# Patient Record
Sex: Female | Born: 1935 | Race: Black or African American | Hispanic: No | Marital: Married | Smoking: Never smoker
Health system: Southern US, Community
[De-identification: ages and names within clinical notes are randomized; demographics above are authoritative.]

## PROBLEM LIST (undated history)

## (undated) DIAGNOSIS — E7800 Pure hypercholesterolemia, unspecified: Secondary | ICD-10-CM

## (undated) DIAGNOSIS — J189 Pneumonia, unspecified organism: Secondary | ICD-10-CM

## (undated) DIAGNOSIS — N189 Chronic kidney disease, unspecified: Secondary | ICD-10-CM

## (undated) DIAGNOSIS — K769 Liver disease, unspecified: Secondary | ICD-10-CM

## (undated) DIAGNOSIS — F0390 Unspecified dementia without behavioral disturbance: Secondary | ICD-10-CM

## (undated) DIAGNOSIS — I10 Essential (primary) hypertension: Secondary | ICD-10-CM

## (undated) DIAGNOSIS — K5790 Diverticulosis of intestine, part unspecified, without perforation or abscess without bleeding: Secondary | ICD-10-CM

## (undated) DIAGNOSIS — E46 Unspecified protein-calorie malnutrition: Secondary | ICD-10-CM

## (undated) DIAGNOSIS — K649 Unspecified hemorrhoids: Secondary | ICD-10-CM

## (undated) HISTORY — PX: CATARACT EXTRACTION: SUR2

---

## 1997-09-23 ENCOUNTER — Ambulatory Visit (HOSPITAL_COMMUNITY): Admission: RE | Admit: 1997-09-23 | Discharge: 1997-09-23 | Payer: Self-pay | Admitting: *Deleted

## 1997-10-12 ENCOUNTER — Ambulatory Visit (HOSPITAL_COMMUNITY): Admission: RE | Admit: 1997-10-12 | Discharge: 1997-10-12 | Payer: Self-pay | Admitting: Gastroenterology

## 1998-10-26 ENCOUNTER — Encounter: Payer: Self-pay | Admitting: Family Medicine

## 1998-10-26 ENCOUNTER — Ambulatory Visit (HOSPITAL_COMMUNITY): Admission: RE | Admit: 1998-10-26 | Discharge: 1998-10-26 | Payer: Self-pay | Admitting: Family Medicine

## 1999-06-13 ENCOUNTER — Other Ambulatory Visit: Admission: RE | Admit: 1999-06-13 | Discharge: 1999-06-13 | Payer: Self-pay | Admitting: Specialist

## 1999-12-18 ENCOUNTER — Encounter: Payer: Self-pay | Admitting: Family Medicine

## 1999-12-18 ENCOUNTER — Ambulatory Visit (HOSPITAL_COMMUNITY): Admission: RE | Admit: 1999-12-18 | Discharge: 1999-12-18 | Payer: Self-pay | Admitting: Family Medicine

## 2002-11-10 ENCOUNTER — Other Ambulatory Visit: Admission: RE | Admit: 2002-11-10 | Discharge: 2002-11-10 | Payer: Self-pay | Admitting: Family Medicine

## 2003-03-17 ENCOUNTER — Ambulatory Visit (HOSPITAL_COMMUNITY): Admission: RE | Admit: 2003-03-17 | Discharge: 2003-03-17 | Payer: Self-pay | Admitting: Gastroenterology

## 2003-03-17 ENCOUNTER — Encounter (INDEPENDENT_AMBULATORY_CARE_PROVIDER_SITE_OTHER): Payer: Self-pay | Admitting: *Deleted

## 2003-03-30 ENCOUNTER — Ambulatory Visit (HOSPITAL_COMMUNITY): Admission: RE | Admit: 2003-03-30 | Discharge: 2003-03-30 | Payer: Self-pay | Admitting: Family Medicine

## 2004-12-20 ENCOUNTER — Other Ambulatory Visit: Admission: RE | Admit: 2004-12-20 | Discharge: 2004-12-20 | Payer: Self-pay | Admitting: Family Medicine

## 2005-09-11 ENCOUNTER — Encounter: Admission: RE | Admit: 2005-09-11 | Discharge: 2005-09-11 | Payer: Self-pay | Admitting: Family Medicine

## 2007-04-14 ENCOUNTER — Other Ambulatory Visit: Admission: RE | Admit: 2007-04-14 | Discharge: 2007-04-14 | Payer: Self-pay | Admitting: Family Medicine

## 2010-08-10 NOTE — Op Note (Signed)
NAME:  Jamie Lester, Jamie Lester                      ACCOUNT NO.:  [id number]   MEDICAL RECORD NO.:  [id number]                   PATIENT TYPE:  AMB   LOCATION:  ENDO                                 FACILITY:  [facility]   PHYSICIAN:  Petra Kuba, M.D.                 DATE OF BIRTH:  09/19/1930   DATE OF PROCEDURE:  03/17/2003  DATE OF DISCHARGE:                                 OPERATIVE REPORT   PROCEDURE:  Colonoscopy with polypectomy.   INDICATION:  Family history of colon cancer due for colonic screening.  Consent was signed after risks, benefits, methods, options thoroughly  discussed multiple times in the past.   MEDICINES USED:  1. Demerol 70.  2. Versed 7.   DESCRIPTION OF PROCEDURE:  Rectal inspection was pertinent for external  hemorrhoids, small.  Digital exam was negative.  Video pediatric adjustable  colonoscope was inserted and despite a tortuous sigmoid colon with  diverticula, able to be advanced to the cecum.  This did require rolling her  on her back and some abdominal pressure.  No other obvious abnormality was  seen but an occasional right-sided diverticula.  The cecum was identified by  the appendiceal orifice and the ileocecal valve.  Prep was fairly adequate.  With lots of washing and suctioning, adequate visus was obtained.  On slow  withdrawal through the colon, there was left greater than right diverticula,  as mentioned above.  There was a descending tiny to small polyp which was  hot biopsied x 1.  The scope was further withdrawn.  Two other tiny polyps  were seen, one tiny, one in the transverse which was cold biopsied and one  other tiny one in the descending which was hot biopsied.  They were all put  in the same container.  Other than the diverticula, no other abnormalities  were seen as we slowly withdrew back to the rectum.  Anorectal pull-through  and retroflexion confirmed some small hemorrhoids.  The scope was  straightened, readvanced a short ways up  the left side of the colon; air was  suctioned, the scope removed.  The patient tolerated the procedure well.  There was no obvious immediate complication.   ENDOSCOPIC DIAGNOSES:  1. Internal-external hemorrhoids.  2. Left greater than right diverticula.  3. Three tiny ascending, transverse, and descending polyps, all hot or cold     biopsied, put in the same container.  4. Otherwise, within normal limits to the cecum.   PLAN:  1. We will a wait pathology.  2. Gastrointestinal follow-up p.r.n.  3. Yearly rectals and guaiacs per Dr. Nicholos Johns.  4. Based on pathology, will determine future colonic screening.  Petra Kuba, M.D.    MEM/MEDQ  D:  03/17/2003  T:  03/17/2003  Job:  [id number]   cc:   Molly Maduro A. Nicholos Johns, M.D.  [address]  [place]  [place] [address]  Fax: [phone]

## 2011-09-04 DIAGNOSIS — Z961 Presence of intraocular lens: Secondary | ICD-10-CM | POA: Diagnosis not present

## 2011-09-04 DIAGNOSIS — H04129 Dry eye syndrome of unspecified lacrimal gland: Secondary | ICD-10-CM | POA: Diagnosis not present

## 2011-09-04 DIAGNOSIS — H52209 Unspecified astigmatism, unspecified eye: Secondary | ICD-10-CM | POA: Diagnosis not present

## 2011-09-04 DIAGNOSIS — H3530 Unspecified macular degeneration: Secondary | ICD-10-CM | POA: Diagnosis not present

## 2011-10-28 DIAGNOSIS — R5383 Other fatigue: Secondary | ICD-10-CM | POA: Diagnosis not present

## 2011-10-28 DIAGNOSIS — K625 Hemorrhage of anus and rectum: Secondary | ICD-10-CM | POA: Diagnosis not present

## 2011-10-28 DIAGNOSIS — K649 Unspecified hemorrhoids: Secondary | ICD-10-CM | POA: Diagnosis not present

## 2011-10-28 DIAGNOSIS — R5381 Other malaise: Secondary | ICD-10-CM | POA: Diagnosis not present

## 2011-10-28 DIAGNOSIS — L989 Disorder of the skin and subcutaneous tissue, unspecified: Secondary | ICD-10-CM | POA: Diagnosis not present

## 2011-11-14 DIAGNOSIS — L821 Other seborrheic keratosis: Secondary | ICD-10-CM | POA: Diagnosis not present

## 2011-11-27 DIAGNOSIS — Z86010 Personal history of colonic polyps: Secondary | ICD-10-CM | POA: Diagnosis not present

## 2011-11-27 DIAGNOSIS — K625 Hemorrhage of anus and rectum: Secondary | ICD-10-CM | POA: Diagnosis not present

## 2011-11-27 DIAGNOSIS — R195 Other fecal abnormalities: Secondary | ICD-10-CM | POA: Diagnosis not present

## 2011-11-27 DIAGNOSIS — Z800 Family history of malignant neoplasm of digestive organs: Secondary | ICD-10-CM | POA: Diagnosis not present

## 2011-12-25 ENCOUNTER — Other Ambulatory Visit: Payer: Self-pay | Admitting: Gastroenterology

## 2011-12-25 DIAGNOSIS — Z86010 Personal history of colonic polyps: Secondary | ICD-10-CM | POA: Diagnosis not present

## 2011-12-25 DIAGNOSIS — Z800 Family history of malignant neoplasm of digestive organs: Secondary | ICD-10-CM | POA: Diagnosis not present

## 2011-12-25 DIAGNOSIS — Z09 Encounter for follow-up examination after completed treatment for conditions other than malignant neoplasm: Secondary | ICD-10-CM | POA: Diagnosis not present

## 2011-12-25 DIAGNOSIS — D126 Benign neoplasm of colon, unspecified: Secondary | ICD-10-CM | POA: Diagnosis not present

## 2011-12-25 DIAGNOSIS — K5730 Diverticulosis of large intestine without perforation or abscess without bleeding: Secondary | ICD-10-CM | POA: Diagnosis not present

## 2013-03-29 DIAGNOSIS — R059 Cough, unspecified: Secondary | ICD-10-CM | POA: Diagnosis not present

## 2013-03-29 DIAGNOSIS — R05 Cough: Secondary | ICD-10-CM | POA: Diagnosis not present

## 2013-03-29 DIAGNOSIS — J209 Acute bronchitis, unspecified: Secondary | ICD-10-CM | POA: Diagnosis not present

## 2013-09-15 DIAGNOSIS — T7840XA Allergy, unspecified, initial encounter: Secondary | ICD-10-CM | POA: Diagnosis not present

## 2014-12-13 DIAGNOSIS — Z0000 Encounter for general adult medical examination without abnormal findings: Secondary | ICD-10-CM | POA: Diagnosis not present

## 2014-12-13 DIAGNOSIS — R8299 Other abnormal findings in urine: Secondary | ICD-10-CM | POA: Diagnosis not present

## 2014-12-13 DIAGNOSIS — N183 Chronic kidney disease, stage 3 (moderate): Secondary | ICD-10-CM | POA: Diagnosis not present

## 2014-12-13 DIAGNOSIS — I10 Essential (primary) hypertension: Secondary | ICD-10-CM | POA: Diagnosis not present

## 2014-12-20 ENCOUNTER — Other Ambulatory Visit: Payer: Self-pay | Admitting: Family Medicine

## 2014-12-20 DIAGNOSIS — R42 Dizziness and giddiness: Secondary | ICD-10-CM

## 2014-12-23 ENCOUNTER — Ambulatory Visit
Admission: RE | Admit: 2014-12-23 | Discharge: 2014-12-23 | Disposition: A | Discharge status: Home / Self Care | Payer: Medicare Other | Source: Ambulatory Visit | Attending: Family Medicine | Admitting: Family Medicine

## 2014-12-23 DIAGNOSIS — R42 Dizziness and giddiness: Secondary | ICD-10-CM

## 2015-01-10 DIAGNOSIS — Z79899 Other long term (current) drug therapy: Secondary | ICD-10-CM | POA: Diagnosis not present

## 2015-01-10 DIAGNOSIS — R7989 Other specified abnormal findings of blood chemistry: Secondary | ICD-10-CM | POA: Diagnosis not present

## 2015-01-10 DIAGNOSIS — E785 Hyperlipidemia, unspecified: Secondary | ICD-10-CM | POA: Diagnosis not present

## 2015-01-10 DIAGNOSIS — I10 Essential (primary) hypertension: Secondary | ICD-10-CM | POA: Diagnosis not present

## 2015-05-10 DIAGNOSIS — N2581 Secondary hyperparathyroidism of renal origin: Secondary | ICD-10-CM | POA: Diagnosis not present

## 2015-05-10 DIAGNOSIS — R7989 Other specified abnormal findings of blood chemistry: Secondary | ICD-10-CM | POA: Diagnosis not present

## 2015-05-10 DIAGNOSIS — N183 Chronic kidney disease, stage 3 (moderate): Secondary | ICD-10-CM | POA: Diagnosis not present

## 2015-06-14 DIAGNOSIS — N183 Chronic kidney disease, stage 3 (moderate): Secondary | ICD-10-CM | POA: Diagnosis not present

## 2015-06-14 DIAGNOSIS — N390 Urinary tract infection, site not specified: Secondary | ICD-10-CM | POA: Diagnosis not present

## 2015-06-14 DIAGNOSIS — E785 Hyperlipidemia, unspecified: Secondary | ICD-10-CM | POA: Diagnosis not present

## 2015-06-14 DIAGNOSIS — R8299 Other abnormal findings in urine: Secondary | ICD-10-CM | POA: Diagnosis not present

## 2015-06-14 DIAGNOSIS — I10 Essential (primary) hypertension: Secondary | ICD-10-CM | POA: Diagnosis not present

## 2015-06-14 DIAGNOSIS — Z79899 Other long term (current) drug therapy: Secondary | ICD-10-CM | POA: Diagnosis not present

## 2015-06-19 ENCOUNTER — Other Ambulatory Visit: Payer: Self-pay | Admitting: Nephrology

## 2015-06-19 DIAGNOSIS — R7989 Other specified abnormal findings of blood chemistry: Secondary | ICD-10-CM

## 2015-06-19 DIAGNOSIS — N2581 Secondary hyperparathyroidism of renal origin: Secondary | ICD-10-CM

## 2015-06-19 DIAGNOSIS — N183 Chronic kidney disease, stage 3 unspecified: Secondary | ICD-10-CM

## 2015-06-29 ENCOUNTER — Ambulatory Visit
Admission: RE | Admit: 2015-06-29 | Discharge: 2015-06-29 | Disposition: A | Discharge status: Home / Self Care | Payer: Medicare Other | Source: Ambulatory Visit | Attending: Nephrology | Admitting: Nephrology

## 2015-06-29 DIAGNOSIS — N183 Chronic kidney disease, stage 3 unspecified: Secondary | ICD-10-CM

## 2015-06-29 DIAGNOSIS — N2581 Secondary hyperparathyroidism of renal origin: Secondary | ICD-10-CM

## 2015-06-29 DIAGNOSIS — R7989 Other specified abnormal findings of blood chemistry: Secondary | ICD-10-CM

## 2015-07-14 ENCOUNTER — Encounter (HOSPITAL_COMMUNITY): Payer: Self-pay

## 2015-07-14 ENCOUNTER — Inpatient Hospital Stay (HOSPITAL_COMMUNITY)
Admission: EM | Admit: 2015-07-14 | Discharge: 2015-07-18 | DRG: 378 | Disposition: A | Discharge status: Home / Self Care | Payer: Medicare Other | Attending: Internal Medicine | Admitting: Internal Medicine

## 2015-07-14 ENCOUNTER — Emergency Department (HOSPITAL_COMMUNITY): Payer: Medicare Other

## 2015-07-14 DIAGNOSIS — F0390 Unspecified dementia without behavioral disturbance: Secondary | ICD-10-CM | POA: Diagnosis present

## 2015-07-14 DIAGNOSIS — K922 Gastrointestinal hemorrhage, unspecified: Secondary | ICD-10-CM

## 2015-07-14 DIAGNOSIS — I10 Essential (primary) hypertension: Secondary | ICD-10-CM | POA: Insufficient documentation

## 2015-07-14 DIAGNOSIS — N179 Acute kidney failure, unspecified: Secondary | ICD-10-CM | POA: Diagnosis present

## 2015-07-14 DIAGNOSIS — F09 Unspecified mental disorder due to known physiological condition: Secondary | ICD-10-CM | POA: Diagnosis present

## 2015-07-14 DIAGNOSIS — K921 Melena: Secondary | ICD-10-CM | POA: Diagnosis not present

## 2015-07-14 DIAGNOSIS — Z7982 Long term (current) use of aspirin: Secondary | ICD-10-CM | POA: Diagnosis not present

## 2015-07-14 DIAGNOSIS — E7800 Pure hypercholesterolemia, unspecified: Secondary | ICD-10-CM | POA: Diagnosis not present

## 2015-07-14 DIAGNOSIS — D500 Iron deficiency anemia secondary to blood loss (chronic): Secondary | ICD-10-CM | POA: Diagnosis not present

## 2015-07-14 DIAGNOSIS — D62 Acute posthemorrhagic anemia: Secondary | ICD-10-CM | POA: Insufficient documentation

## 2015-07-14 DIAGNOSIS — R55 Syncope and collapse: Secondary | ICD-10-CM | POA: Diagnosis present

## 2015-07-14 DIAGNOSIS — K5731 Diverticulosis of large intestine without perforation or abscess with bleeding: Principal | ICD-10-CM | POA: Diagnosis present

## 2015-07-14 DIAGNOSIS — E785 Hyperlipidemia, unspecified: Secondary | ICD-10-CM | POA: Diagnosis present

## 2015-07-14 DIAGNOSIS — I129 Hypertensive chronic kidney disease with stage 1 through stage 4 chronic kidney disease, or unspecified chronic kidney disease: Secondary | ICD-10-CM | POA: Diagnosis present

## 2015-07-14 DIAGNOSIS — N183 Chronic kidney disease, stage 3 (moderate): Secondary | ICD-10-CM | POA: Diagnosis present

## 2015-07-14 DIAGNOSIS — K625 Hemorrhage of anus and rectum: Secondary | ICD-10-CM | POA: Diagnosis not present

## 2015-07-14 DIAGNOSIS — K5791 Diverticulosis of intestine, part unspecified, without perforation or abscess with bleeding: Secondary | ICD-10-CM | POA: Diagnosis not present

## 2015-07-14 DIAGNOSIS — Z79899 Other long term (current) drug therapy: Secondary | ICD-10-CM | POA: Diagnosis not present

## 2015-07-14 DIAGNOSIS — D649 Anemia, unspecified: Secondary | ICD-10-CM | POA: Diagnosis not present

## 2015-07-14 HISTORY — DX: Unspecified hemorrhoids: K64.9

## 2015-07-14 HISTORY — DX: Diverticulosis of intestine, part unspecified, without perforation or abscess without bleeding: K57.90

## 2015-07-14 HISTORY — DX: Essential (primary) hypertension: I10

## 2015-07-14 HISTORY — DX: Pure hypercholesterolemia, unspecified: E78.00

## 2015-07-14 HISTORY — DX: Unspecified dementia, unspecified severity, without behavioral disturbance, psychotic disturbance, mood disturbance, and anxiety: F03.90

## 2015-07-14 LAB — CBC
HCT: 20.9 % — ABNORMAL LOW (ref 36.0–46.0)
HCT: 20.6 % — ABNORMAL LOW (ref 36.0–46.0)
Hemoglobin: 7 g/dL — ABNORMAL LOW (ref 12.0–15.0)
Hemoglobin: 6.8 g/dL — CL (ref 12.0–15.0)
MCH: 28.7 pg (ref 26.0–34.0)
MCH: 29.2 pg (ref 26.0–34.0)
MCHC: 33.5 g/dL (ref 30.0–36.0)
MCHC: 33 g/dL (ref 30.0–36.0)
MCV: 85.7 fL (ref 78.0–100.0)
MCV: 88.4 fL (ref 78.0–100.0)
PLATELETS: 254 10*3/uL (ref 150–400)
Platelets: 252 K/uL (ref 150–400)
RBC: 2.44 MIL/uL — ABNORMAL LOW (ref 3.87–5.11)
RBC: 2.33 MIL/uL — ABNORMAL LOW (ref 3.87–5.11)
RDW: 14.7 % (ref 11.5–15.5)
RDW: 15 % (ref 11.5–15.5)
WBC: 7.2 10*3/uL (ref 4.0–10.5)
WBC: 7.1 K/uL (ref 4.0–10.5)

## 2015-07-14 LAB — I-STAT TROPONIN, ED: TROPONIN I, POC: 0.01 ng/mL (ref 0.00–0.08)

## 2015-07-14 LAB — CBC WITH DIFFERENTIAL/PLATELET
BASOS ABS: 0 10*3/uL (ref 0.0–0.1)
BASOS PCT: 0 %
Eosinophils Absolute: 0 10*3/uL (ref 0.0–0.7)
Eosinophils Relative: 1 %
HEMATOCRIT: 23.7 % — AB (ref 36.0–46.0)
HEMOGLOBIN: 7.9 g/dL — AB (ref 12.0–15.0)
Lymphocytes Relative: 35 %
Lymphs Abs: 2.5 10*3/uL (ref 0.7–4.0)
MCH: 29.3 pg (ref 26.0–34.0)
MCHC: 33.3 g/dL (ref 30.0–36.0)
MCV: 87.8 fL (ref 78.0–100.0)
Monocytes Absolute: 0.3 10*3/uL (ref 0.1–1.0)
Monocytes Relative: 5 %
NEUTROS ABS: 4.4 10*3/uL (ref 1.7–7.7)
NEUTROS PCT: 60 %
Platelets: 287 10*3/uL (ref 150–400)
RBC: 2.7 MIL/uL — ABNORMAL LOW (ref 3.87–5.11)
RDW: 15 % (ref 11.5–15.5)
WBC: 7.3 10*3/uL (ref 4.0–10.5)

## 2015-07-14 LAB — COMPREHENSIVE METABOLIC PANEL
ALBUMIN: 3.3 g/dL — AB (ref 3.5–5.0)
ALT: 12 U/L — ABNORMAL LOW (ref 14–54)
AST: 16 U/L (ref 15–41)
Alkaline Phosphatase: 47 U/L (ref 38–126)
Anion gap: 4 — ABNORMAL LOW (ref 5–15)
BILIRUBIN TOTAL: 0.3 mg/dL (ref 0.3–1.2)
BUN: 36 mg/dL — AB (ref 6–20)
CO2: 20 mmol/L — ABNORMAL LOW (ref 22–32)
Calcium: 8.9 mg/dL (ref 8.9–10.3)
Chloride: 117 mmol/L — ABNORMAL HIGH (ref 101–111)
Creatinine, Ser: 1.74 mg/dL — ABNORMAL HIGH (ref 0.44–1.00)
GFR calc Af Amer: 30 mL/min — ABNORMAL LOW (ref >60)
GFR calc non Af Amer: 26 mL/min — ABNORMAL LOW (ref >60)
GLUCOSE: 134 mg/dL — AB (ref 65–99)
POTASSIUM: 4.4 mmol/L (ref 3.5–5.1)
Sodium: 141 mmol/L (ref 135–145)
TOTAL PROTEIN: 6.3 g/dL — AB (ref 6.5–8.1)

## 2015-07-14 LAB — PREPARE RBC (CROSSMATCH)

## 2015-07-14 LAB — PROTIME-INR
INR: 1.22 (ref 0.00–1.49)
Prothrombin Time: 15.1 seconds (ref 11.6–15.2)

## 2015-07-14 LAB — ABO/RH: ABO/RH(D): O POS

## 2015-07-14 LAB — APTT: aPTT: 31 seconds (ref 24–37)

## 2015-07-14 MED ORDER — ONDANSETRON HCL 4 MG PO TABS: 4.0000 mg | ORAL_TABLET | Freq: Four times a day (QID) | ORAL | Status: DC | PRN

## 2015-07-14 MED ORDER — ONDANSETRON HCL 4 MG/2ML IJ SOLN: 4.0000 mg | Freq: Four times a day (QID) | INTRAMUSCULAR | Status: DC | PRN

## 2015-07-14 MED ORDER — CHLORHEXIDINE GLUCONATE 0.12 % MT SOLN
15.0000 mL | Freq: Two times a day (BID) | OROMUCOSAL | Status: DC
  Administered 2015-07-14 – 2015-07-18 (×9): 15 mL via OROMUCOSAL
  Filled 2015-07-14 (×10): qty 15

## 2015-07-14 MED ORDER — ACETAMINOPHEN 325 MG PO TABS
650.0000 mg | ORAL_TABLET | Freq: Four times a day (QID) | ORAL | Status: DC | PRN
  Filled 2015-07-14: qty 2

## 2015-07-14 MED ORDER — ACETAMINOPHEN 650 MG RE SUPP: 650.0000 mg | Freq: Four times a day (QID) | RECTAL | Status: DC | PRN

## 2015-07-14 MED ORDER — SODIUM CHLORIDE 0.9 % IV BOLUS (SEPSIS)
1000.0000 mL | Freq: Once | INTRAVENOUS | Status: AC
  Administered 2015-07-14: 1000 mL via INTRAVENOUS

## 2015-07-14 MED ORDER — PANTOPRAZOLE SODIUM 40 MG IV SOLR
80.0000 mg | Freq: Once | INTRAVENOUS | Status: AC
  Administered 2015-07-14: 80 mg via INTRAVENOUS
  Filled 2015-07-14: qty 80

## 2015-07-14 MED ORDER — SODIUM CHLORIDE 0.9 % IV SOLN
8.0000 mg/h | INTRAVENOUS | Status: DC
  Administered 2015-07-14 – 2015-07-15 (×3): 8 mg/h via INTRAVENOUS
  Filled 2015-07-14 (×9): qty 80

## 2015-07-14 MED ORDER — SODIUM CHLORIDE 0.9 % IV SOLN: Freq: Once | INTRAVENOUS | Status: DC

## 2015-07-14 MED ORDER — SODIUM CHLORIDE 0.9 % IV SOLN
INTRAVENOUS | Status: AC
  Administered 2015-07-14: 15:00:00 via INTRAVENOUS

## 2015-07-14 MED ORDER — CETYLPYRIDINIUM CHLORIDE 0.05 % MT LIQD
7.0000 mL | Freq: Two times a day (BID) | OROMUCOSAL | Status: DC
  Administered 2015-07-14 – 2015-07-16 (×5): 7 mL via OROMUCOSAL

## 2015-07-14 MED ORDER — PANTOPRAZOLE SODIUM 40 MG IV SOLR: 40.0000 mg | Freq: Two times a day (BID) | INTRAVENOUS | Status: DC

## 2015-07-14 NOTE — ED Notes (Addendum)
Per EMS - patient comes from home where she lives with her son.  Patient was on toilet today and had a near syncope episode in which she fell from toilet.  EMS states bright red blood was in toilet where patient had used it.  Patient has no history of GI bleeding. Patient was diaphoretic on scene.  Patient has dementia and is at baseline mentation.  She is ambulatory around house without assistance.  Vitals on scene 100 plapated, 120/72 en route.   HR 102 - 117.  Patient has 18 in [place], had 250 ml bolus en route.  Patient CBG on scene was 226.

## 2015-07-14 NOTE — Consult Note (Signed)
Referring Provider:   Dr. Gilles Chiquito  Primary Care Physician:  Dr. Antony Blackbird Primary Gastroenterologist:  Dr. Cristina Gong  Reason for Consultation:  Hematochezia  HPI: Jamie Lester is a 82 y.o. 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 Since then, she has been admitted to the hospital and has had at least one further bloody bowel movement. She denies any abdominal symptoms such as abdominal pain.  The patient is known to have pancolonic diverticulosis based on previous colonoscopy by me, most recently in October 2013, at which time no polyps were found. She is also on a daily 81 mg aspirin without PPI coverage, but she has not had any upper tract symptomatology. The patient has had regular soft bowel movements, no problem with constipation or fecal impaction to place her at risk for stercoral ulcerations of the rectum.  In the emergency room, the patient had a hemoglobin of 7.9, which has subsequently fallen to 7.0. This is as compared to a baseline hemoglobin of 11.7 on 06/14/2015, in her primary physician's office.   Past Medical History  Diagnosis Date  . Dementia   . Hemorrhoid   . Diverticulosis   . Hypertension   . Hypercholesteremia     Past Surgical History  Procedure Laterality Date  . Cesarean section      Prior to Admission medications   Medication Sig Start Date End Date Taking? Authorizing Provider  amLODipine (NORVASC) 2.5 MG tablet Take 2.5 mg by mouth daily. 06/26/15  Yes Historical Provider, MD  aspirin EC 81 MG tablet Take 81 mg by mouth daily.   Yes Historical Provider, MD  atorvastatin (LIPITOR) 40 MG tablet Take 40 mg by mouth daily. 07/13/15  Yes Historical Provider, MD  Multiple Vitamin (MULTIVITAMIN WITH MINERALS) TABS tablet Take 1 tablet by mouth daily.   Yes Historical Provider, MD    Current Facility-Administered Medications  Medication Dose Route  Frequency Provider Last Rate Last Dose  . 0.9 %  sodium chloride infusion   Intravenous Continuous Sid Falcon, MD 75 mL/hr at 07/14/15 1506    . acetaminophen (TYLENOL) tablet 650 mg  650 mg Oral Q6H PRN Sid Falcon, MD       Or  . acetaminophen (TYLENOL) suppository 650 mg  650 mg Rectal Q6H PRN Sid Falcon, MD      . antiseptic oral rinse (CPC / CETYLPYRIDINIUM CHLORIDE 0.05%) solution 7 mL  7 mL Mouth Rinse q12n4p Sid Falcon, MD   7 mL at 07/14/15 1649  . chlorhexidine (PERIDEX) 0.12 % solution 15 mL  15 mL Mouth Rinse BID Sid Falcon, MD   15 mL at 07/14/15 1649  . ondansetron (ZOFRAN) tablet 4 mg  4 mg Oral Q6H PRN Sid Falcon, MD       Or  . ondansetron [facility]) injection 4 mg  4 mg Intravenous Q6H PRN Sid Falcon, MD      . pantoprazole (PROTONIX) 80 mg in sodium chloride 0.9 % 250 mL (0.32 mg/mL) infusion  8 mg/hr Intravenous Continuous Forde Dandy, MD 25 mL/hr at 07/14/15 1649 8 mg/hr at 07/14/15 1649    Allergies as of 07/14/2015  . (No Known Allergies)    Family History  Problem Relation Age of Onset  . Colon cancer Neg Hx     Social History   Social History  . Marital Status: Married  Spouse Name: N/A  . Number of Children: N/A  . Years of Education: N/A   Occupational History  . Not on file.   Social History Main Topics  . Smoking status: Never Smoker   . Smokeless tobacco: Not on file  . Alcohol Use: No  . Drug Use: No  . Sexual Activity: Not on file   Other Topics Concern  . Not on file   Social History Narrative  . No narrative on file    Review of Systems: See history of present illness  Physical Exam: Vital signs in last 24 hours: Temp:  [97.5 F (36.4 C)-97.6 F (36.4 C)] 97.5 F (36.4 C) (04/21 1130) Pulse Rate:  [67-84] 84 (04/21 1130) Resp:  [16-18] 16 (04/21 1130) BP: (107-133)/(70-87) 107/87 mmHg (04/21 1130) SpO2:  [100 %] 100 % (04/21 1130) Last BM Date: 07/14/15 General:  Pleasant elderly slightly  demented African-American female in no evident distress. Multiple family members at bedside. Head:  Normocephalic and atraumatic. Eyes:  Sclera clear, no icterus.   Conjunctiva pale. Mouth:   No ulcerations or lesions.  Oropharynx pale but moist. Neck:   No masses or thyromegaly. Lungs:  Clear throughout to auscultation.   No wheezes, crackles, or rhonchi. No evident respiratory distress. Heart:   Regular rate and rhythm; no murmurs, clicks, rubs,  or gallops. Abdomen:  Soft, nontender, nontympanitic, and nondistended. No masses, hepatosplenomegaly or ventral hernias noted. Normal bowel sounds, without bruits, guarding, or rebound.   Msk:   Symmetrical without gross deformities. Pulses:  Normal radial pulse is noted. Extremities:   Without clubbing, cyanosis, or edema. Neurologic:  Alert but slightly inappropriate;  grossly normal neurologically. Skin:  Intact without significant lesions or rashes. Cervical Nodes:  No significant cervical adenopathy. Psych:   Alert and cooperative. Normal mood and affect.  Intake/Output from previous day:   Intake/Output this shift:    Lab Results:  Recent Labs  07/14/15 0914 07/14/15 1540  WBC 7.3 7.2  HGB 7.9* 7.0*  HCT 23.7* 20.9*  PLT 287 254   BMET  Recent Labs  07/14/15 0914  NA 141  K 4.4  CL 117*  CO2 20*  GLUCOSE 134*  BUN 36*  CREATININE 1.74*  CALCIUM 8.9   LFT  Recent Labs  07/14/15 0914  PROT 6.3*  ALBUMIN 3.3*  AST 16  ALT 12*  ALKPHOS 47  BILITOT 0.3   PT/INR  Recent Labs  07/14/15 0914  LABPROT 15.1  INR 1.22    Studies/Results: Ct Head Wo Contrast  07/14/2015  CLINICAL DATA:  Recent syncopal event following bloody bowel movement EXAM: CT HEAD WITHOUT CONTRAST TECHNIQUE: Contiguous axial images were obtained from the base of the skull through the vertex without intravenous contrast. COMPARISON:  None. FINDINGS: The bony calvarium is intact. Mild atrophic changes and chronic white matter ischemic  changes are seen. No findings to suggest acute hemorrhage, acute infarction or space-occupying mass lesion are noted. IMPRESSION: Chronic atrophic and ischemic changes. No acute abnormality is noted. Electronically Signed   By: Inez Catalina M.D.   On: 07/14/2015 10:05    Impression:   probable lower GI bleed, characterized by hematochezia with transient hemodynamic instability and 5 g drop in hemoglobin from baseline, without any significant rise in BUN. Given the patient's prior history of pancolonic diverticulosis without other findings such as vascular ectasia in the colon, I think this is most likely a diverticular bleed. The patient's low-dose aspirin would put her at some risk for upper  tract bleeding but, for the reasons mentioned above, I doubt that is the case here.   Plan: I would favor supportive care. Typically, there is little role for colonoscopic evaluation or colonoscopic treatment of acute lower GI bleeding in a patient with known diverticular disease. Therefore, if she has brisk bleeding, I would go to a bleeding scan to be followed by IR embolization if positive.   LOS: 0 days   Tahtiana Rozier V  07/14/2015, 6:47 PM   Pager [phone] If no answer or after 5 PM call [phone]

## 2015-07-14 NOTE — ED Notes (Signed)
PT was unable to go while on bedpan

## 2015-07-14 NOTE — ED Provider Notes (Signed)
CSN: [id number]     Arrival date & time 07/14/15  [id number] History   First MD Initiated Contact with Patient 07/14/15 [phone]     Chief Complaint  Patient presents with  . GI Bleeding  . Near Syncope     (Consider location/radiation/quality/duration/timing/severity/associated sxs/prior Treatment) HPI Level V caveat due to dementia.  History provided by EMS as patient has history of dementia. 82 year old female who presents with GI bleeding. According to EMS, patient is taken care of by her son at home. She had notified him that she was using the restroom today, when he heard a thump coming from the bathroom. On his arrival toilet bowl was full of bloody stool. She was on the ground, appearing pale and diaphoretic. Initially mildly confused, but subsequently at her baseline mental status according to family. She has no prior history of GI bleed. Did have lower endoscopy performed by Dr. Alyson Ingles in 2013 revealing diverticula, polyps, and hemorrhoids. Take baby aspirin.  Past Medical History  Diagnosis Date  . Dementia   . Hemorrhoid   . Diverticulosis   . Hypertension   . Hypercholesteremia    Past Surgical History  Procedure Laterality Date  . Cesarean section     History reviewed. No pertinent family history. Social History  Substance Use Topics  . Smoking status: Never Smoker   . Smokeless tobacco: None  . Alcohol Use: No   OB History    No data available     Review of Systems  Unable to perform ROS: Dementia       Allergies  Review of patient's allergies indicates no known allergies.  Home Medications   Prior to Admission medications   Medication Sig Start Date End Date Taking? Authorizing Provider  amLODipine (NORVASC) 2.5 MG tablet Take 2.5 mg by mouth daily. 06/26/15  Yes Historical Provider, MD  aspirin EC 81 MG tablet Take 81 mg by mouth daily.   Yes Historical Provider, MD  atorvastatin (LIPITOR) 40 MG tablet Take 40 mg by mouth daily. 07/13/15  Yes Historical  Provider, MD  Multiple Vitamin (MULTIVITAMIN WITH MINERALS) TABS tablet Take 1 tablet by mouth daily.   Yes Historical Provider, MD   BP 133/70 mmHg  Pulse 67  Temp(Src) 97.6 F (36.4 C) (Oral)  Resp 18  SpO2 100% Physical Exam Physical Exam  Nursing note and vitals reviewed. Constitutional: Elderly woman, non-toxic, and in no acute distress Head: Normocephalic and atraumatic.  Mouth/Throat: Oropharynx is clear and moist.  Neck: Normal range of motion. Neck supple.  Cardiovascular: Normal rate and regular rhythm.  no edema. Pulmonary/Chest: Effort normal and breath sounds normal.  Abdominal: Soft. There is no tenderness. There is no rebound and no guarding.  Hematochezia on rectal exam.  Musculoskeletal: Normal range of motion.  Neurological: Alert, no facial droop, fluent speech, moves all extremities symmetrically Skin: Skin is warm and dry.  Psychiatric: Cooperative  ED Course  Procedures (including critical care time) Labs Review Labs Reviewed  CBC WITH DIFFERENTIAL/PLATELET - Abnormal; Notable for the following:    RBC 2.70 (*)    Hemoglobin 7.9 (*)    HCT 23.7 (*)    All other components within normal limits  COMPREHENSIVE METABOLIC PANEL - Abnormal; Notable for the following:    Chloride 117 (*)    CO2 20 (*)    Glucose, Bld 134 (*)    BUN 36 (*)    Creatinine, Ser 1.74 (*)    Total Protein 6.3 (*)    Albumin 3.3 (*)  ALT 12 (*)    GFR calc non Af Amer 26 (*)    GFR calc Af Amer 30 (*)    Anion gap 4 (*)    All other components within normal limits  PROTIME-INR  APTT  I-STAT TROPOININ, ED  TYPE AND SCREEN  ABO/RH    Imaging Review Ct Head Wo Contrast  07/14/2015  CLINICAL DATA:  Recent syncopal event following bloody bowel movement EXAM: CT HEAD WITHOUT CONTRAST TECHNIQUE: Contiguous axial images were obtained from the base of the skull through the vertex without intravenous contrast. COMPARISON:  None. FINDINGS: The bony calvarium is intact. Mild  atrophic changes and chronic white matter ischemic changes are seen. No findings to suggest acute hemorrhage, acute infarction or space-occupying mass lesion are noted. IMPRESSION: Chronic atrophic and ischemic changes. No acute abnormality is noted. Electronically Signed   By: Inez Catalina M.D.   On: 07/14/2015 10:05   I have personally reviewed and evaluated these images and lab results as part of my medical decision-making.   EKG Interpretation   Date/Time:  Friday July 14 2015 09:21:02 EDT Ventricular Rate:  65 PR Interval:  158 QRS Duration: 83 QT Interval:  410 QTC Calculation: 426 R Axis:   61 Text Interpretation:  Sinus rhythm Atrial premature complex No old tracing  to compare Confirmed by JACUBOWITZ  MD, SAM [phone]) on 07/14/2015 9:28:34  AM      MDM   Final diagnoses:  Lower GI bleed  Syncope and collapse  Acute blood loss anemia    82 year old female with no prior history of GI bleeding who presents with lower GI bleed and syncopal episode. On presentation she is hemodynamically stable and felt to be at her baseline mental status. She has a soft and benign abdomen with hematochezia on rectal exam. Hemoglobin is 7.9, with no prior for comparison. An mildly elevated BUN/creatinine ratio. Discussed with equal GI physician on-call. Suspecting likely diverticular bleed given her prior history of diverticulosis. Less suspicious for upper GI bleed, but given mildly elevated BUN and creatinine ratio was started empirically on protonix drip. Subsequently discussed with Dr. Daryll Drown will admit to hospitalist service.   Forde Dandy, MD 07/14/15 1021

## 2015-07-14 NOTE — ED Notes (Signed)
Bed: WA07 Expected date:  Expected time:  Means of arrival:  Comments: EMS GI bleed syncope

## 2015-07-14 NOTE — H&P (Addendum)
History and Physical    Jamie Lester [id number] DOB: [birth year] DOA: 07/14/2015  Referring MD/NP/PA: Oleta Mouse PCP: Dr. Chapman Fitch at [facility]  Patient coming from: Home  Chief Complaint: Bleeding  HPI: Jamie SCHER is a 82 y.o. female with medical history significant of dementia, HTN, HLD who presents for lower GI bleeding. Jamie Lester does not remember the event, but her son, who is her primary caregiver, reports that this morning Jamie Lester was in her normal state of health and was upstairs going to the restroom.  [name] (the son) was heading upstairs when he heard a thump and a yell.  He found his mother in the restroom on the floor.  She was nonresponsive, diaphoretic and had her eyes rolled back in her head. The toilet bowl was full of blood and stool.  She came to and was back to her normal self and she was transported to the hospital by EMS. When EMS arrived, she did have a low blood pressure, but that has now improved.  According to [facility], Ms. Zirpoli has been in her normal state of health.  She has complained of no recent issues.  She denies chest pain, lightheadedness, dizziness, falling, abdominal pain, blood in the stool, nausea, vomiting, change in bowel habits, change in urinary habits, pain anywhere in her body.   Report from ED and son confirmed is that patient had a colonoscopy in 2013 which showed diverticulae and an adenomatous polyp (confirmed on pathology report)  ED Course: Hemodynamically stable, benign abdomen, Hgb found to be 7.9.  Started on protonix for possible brisk upper GI bleeding.  GI on call was notified and will see patient.   Review of Systems: As per HPI otherwise 10 point review of systems negative.   Past Medical History  Diagnosis Date  . Dementia   . Hemorrhoid   . Diverticulosis   . Hypertension   . Hypercholesteremia     Past Surgical History  Procedure Laterality Date  . Cesarean section      Reports that she has never smoked. She  reports that she does not drink alcohol or use illicit drugs.  No Known Allergies  Family History  Problem Relation Age of Onset  . Colon cancer Neg Hx      Prior to Admission medications   Medication Sig Start Date End Date Taking? Authorizing Provider  amLODipine (NORVASC) 2.5 MG tablet Take 2.5 mg by mouth daily. 06/26/15  Yes Historical Provider, MD  aspirin EC 81 MG tablet Take 81 mg by mouth daily.   Yes Historical Provider, MD  atorvastatin (LIPITOR) 40 MG tablet Take 40 mg by mouth daily. 07/13/15  Yes Historical Provider, MD  Multiple Vitamin (MULTIVITAMIN WITH MINERALS) TABS tablet Take 1 tablet by mouth daily.   Yes Historical Provider, MD    Physical Exam: Filed Vitals:   07/14/15 0921  BP: 133/70  Pulse: 67  Temp: 97.6 F (36.4 C)  TempSrc: Oral  Resp: 18  SpO2: 100%    Constitutional: NAD, calm, comfortable, reports that she is cold Filed Vitals:   07/14/15 0921  BP: 133/70  Pulse: 67  Temp: 97.6 F (36.4 C)  TempSrc: Oral  Resp: 18  SpO2: 100%   Eyes: PERRL, lids normal, possibly some mild conjunctival pallor ENMT: Mucous membranes are moist. Dentition somewhat poor Neck: normal, supple, no masses Respiratory: clear to auscultation bilaterally, no wheezing, no crackles. Normal respiratory effort.   No accessory muscle use.  Cardiovascular: Regular rate and normal rhythm, no  murmurs / rubs / gallops. No extremity edema. 2+ pedal pulses.  Abdomen: no tenderness, no masses palpated. Bowel sounds positive.  Musculoskeletal: no clubbing / cyanosis. No joint deformity upper and lower extremities.  Skin: no rashes, lesions, ulcers. Skin to buttocks and perineum intact Psychiatric: Alert and oriented to place and son.  Unable to explain medical situation.     Labs on Admission: I have personally reviewed following labs and imaging studies  CBC:  Recent Labs Lab 07/14/15 0914  WBC 7.3  NEUTROABS 4.4  HGB 7.9*  HCT 23.7*  MCV 87.8  PLT [id number]   Basic  Metabolic Panel:  Recent Labs Lab 07/14/15 0914  NA 141  K 4.4  CL 117*  CO2 20*  GLUCOSE 134*  BUN 36*  CREATININE 1.74*  CALCIUM 8.9   Liver Function Tests:  Recent Labs Lab 07/14/15 0914  AST 16  ALT 12*  ALKPHOS 47  BILITOT 0.3  PROT 6.3*  ALBUMIN 3.3*   Coagulation Profile:  Recent Labs Lab 07/14/15 0914  INR 1.22    Radiological Exams on Admission: Ct Head Wo Contrast  07/14/2015  CLINICAL DATA:  Recent syncopal event following bloody bowel movement EXAM: CT HEAD WITHOUT CONTRAST TECHNIQUE: Contiguous axial images were obtained from the base of the skull through the vertex without intravenous contrast. COMPARISON:  None. FINDINGS: The bony calvarium is intact. Mild atrophic changes and chronic white matter ischemic changes are seen. No findings to suggest acute hemorrhage, acute infarction or space-occupying mass lesion are noted. IMPRESSION: Chronic atrophic and ischemic changes. No acute abnormality is noted. Electronically Signed   By: Inez Catalina M.D.   On: 07/14/2015 10:05    EKG: Independently reviewed. Sinus rhythm, PAC noted.  Some TW abnormalities in lateral leads.  She has no chest pain and no previous to compare.   Assessment/Plan  Acute lower GI bleeding - Lower source is likely given bright red appearance, no GI symptoms and minimally elevated BUN.  This could be a brisk upper source, but she is hemodynamically stable currently which speaks against - NPO until seen by GI - Known history of diverticulae, increasing chance this is a diverticular bleed - Hgb is 7.9 on admission, son reports he has never been told she is anemic - She is hemodynamically stable with good blood pressure, so I think she can go to telemetry for now.  Change in status would necessitate a move to SDU - Monitor vitals q4 hours today  - CBC q 6 hours today  - Transfuse for Hgb < 7.0 - Follow up GI recommendations - Continue PPI drip for now  AKI vs. CKD - BUN/Cr  elevated, unknown baseline.  DDx includes dehydration, 2/2 to GIB or CKD - IVF with NS at 75cc/hr - Trend - Monitor for urinary changes - Consider getting outside [facility] from [facility] for previous blood work.   Acute Blood loss anemia - Trend CBC as above - Orthostatic vitals - Transfuse for Hgb < 7.0    Dementia - Stable, son in room.  - If any issues with memory, redirect, keep day bright and night dark to avoid agitation or day night reversal.     Hypertension - BP stable now - Holding home amlodipine given concern for ongoing bleeding - If BP starts to rise, will add back    Hypercholesteremia -  Holding atorvastatin for now given she will be NPO  Diet: NPO pending GI recommendations for possible procedure.    DVT prophylaxis: SCDs  given active bleed Code Status: Full, confirmed with son Family Communication: Company secretary, son, at bedside Disposition Plan: Pending work up of GI bleeding, 1-3 days Consults called: [facility] Admission status: Inpatient, telemetry    Gilles Chiquito MD [facility] Pager [phone]  If 7PM-7AM, please contact night-coverage [email or web address] Password [email or web address]  07/14/2015, 11:10 AM

## 2015-07-14 NOTE — ED Notes (Signed)
MD at bedside. 

## 2015-07-15 DIAGNOSIS — K922 Gastrointestinal hemorrhage, unspecified: Secondary | ICD-10-CM

## 2015-07-15 DIAGNOSIS — F0390 Unspecified dementia without behavioral disturbance: Secondary | ICD-10-CM

## 2015-07-15 DIAGNOSIS — N179 Acute kidney failure, unspecified: Secondary | ICD-10-CM

## 2015-07-15 DIAGNOSIS — I10 Essential (primary) hypertension: Secondary | ICD-10-CM

## 2015-07-15 DIAGNOSIS — E7800 Pure hypercholesterolemia, unspecified: Secondary | ICD-10-CM

## 2015-07-15 LAB — CBC
HCT: 27.1 % — ABNORMAL LOW (ref 36.0–46.0)
HEMATOCRIT: 25.6 % — AB (ref 36.0–46.0)
HEMATOCRIT: 22.3 % — AB (ref 36.0–46.0)
HEMOGLOBIN: 9.3 g/dL — AB (ref 12.0–15.0)
HEMOGLOBIN: 7.7 g/dL — AB (ref 12.0–15.0)
Hemoglobin: 8.7 g/dL — ABNORMAL LOW (ref 12.0–15.0)
MCH: 29.6 pg (ref 26.0–34.0)
MCH: 28.9 pg (ref 26.0–34.0)
MCH: 29.2 pg (ref 26.0–34.0)
MCHC: 34.3 g/dL (ref 30.0–36.0)
MCHC: 34 g/dL (ref 30.0–36.0)
MCHC: 34.5 g/dL (ref 30.0–36.0)
MCV: 86.3 fL (ref 78.0–100.0)
MCV: 85 fL (ref 78.0–100.0)
MCV: 84.5 fL (ref 78.0–100.0)
PLATELETS: 222 10*3/uL (ref 150–400)
PLATELETS: 247 10*3/uL (ref 150–400)
PLATELETS: 231 10*3/uL (ref 150–400)
RBC: 3.14 MIL/uL — ABNORMAL LOW (ref 3.87–5.11)
RBC: 3.01 MIL/uL — AB (ref 3.87–5.11)
RBC: 2.64 MIL/uL — AB (ref 3.87–5.11)
RDW: 15 % (ref 11.5–15.5)
RDW: 14.8 % (ref 11.5–15.5)
RDW: 14.7 % (ref 11.5–15.5)
WBC: 9.4 10*3/uL (ref 4.0–10.5)
WBC: 9.7 10*3/uL (ref 4.0–10.5)
WBC: 9.9 10*3/uL (ref 4.0–10.5)

## 2015-07-15 LAB — BASIC METABOLIC PANEL
Anion gap: 10 (ref 5–15)
BUN: 31 mg/dL — ABNORMAL HIGH (ref 6–20)
CALCIUM: 8.9 mg/dL (ref 8.9–10.3)
CHLORIDE: 114 mmol/L — AB (ref 101–111)
CO2: 16 mmol/L — AB (ref 22–32)
CREATININE: 1.42 mg/dL — AB (ref 0.44–1.00)
GFR calc Af Amer: 38 mL/min — ABNORMAL LOW (ref >60)
GFR calc non Af Amer: 33 mL/min — ABNORMAL LOW (ref >60)
GLUCOSE: 100 mg/dL — AB (ref 65–99)
Potassium: 4.3 mmol/L (ref 3.5–5.1)
Sodium: 140 mmol/L (ref 135–145)

## 2015-07-15 LAB — HEMOGLOBIN A1C
HEMOGLOBIN A1C: 6.2 % — AB (ref 4.8–5.6)
Mean Plasma Glucose: 131 mg/dL

## 2015-07-15 LAB — GLUCOSE, CAPILLARY: Glucose-Capillary: 90 mg/dL (ref 65–99)

## 2015-07-15 LAB — PREPARE RBC (CROSSMATCH)

## 2015-07-15 MED ORDER — HYDRALAZINE HCL 20 MG/ML IJ SOLN
10.0000 mg | Freq: Once | INTRAMUSCULAR | Status: AC
  Administered 2015-07-15: 10 mg via INTRAVENOUS
  Filled 2015-07-15: qty 1

## 2015-07-15 MED ORDER — SODIUM CHLORIDE 0.9% FLUSH
3.0000 mL | Freq: Two times a day (BID) | INTRAVENOUS | Status: DC
  Administered 2015-07-15: 3 mL via INTRAVENOUS

## 2015-07-15 MED ORDER — SODIUM CHLORIDE 0.9 % IV SOLN
Freq: Once | INTRAVENOUS | Status: AC
  Administered 2015-07-15: 16:00:00 via INTRAVENOUS

## 2015-07-15 NOTE — Progress Notes (Signed)
Intermittent bloody stools from 0130-0400, One unit PRBC finished at 0230 for hgb 6.8  Will repeat cbc at 0500  Patient care reported off to [facility] at 0400

## 2015-07-15 NOTE — Progress Notes (Signed)
GASTROENTEROLOGY PROGRESS NOTE  Problem:   Hematochezia in the setting of known pancolonic diverticulosis and daily aspirin.  Subjective: Patient is demented but does not seem to be in any distress and in fact, is talking about going home.  She is apparently having bowel movements, but it sounds as though these are infrequent and smaller in amount. Certainly, no evidence of frequent, large volume hematochezia present.  Objective: No evident distress. Vital signs unremarkable. Heart rate is slightly on the rapid side, but radial pulse is full. Patient is awake and alert.  Patient has had an appropriate posttransfusion rise in hemoglobin, which has declined slightly consistent with equilibration. Current hemoglobin 8.7. Has not required transfusion.   Assessment: It appears that this patient's bleeding has stopped. Most likely, it was diverticular in origin.  Plan: 1. Okay to advance diet (ordered) 2. Okay to cut back on frequency of blood drawing (ordered) 3. Okay to stop Protonix infusion when current bag is finished (ordered). I do not see a definite indication for long-term PPI therapy in this patient.  Cleotis Nipper, M.D. 07/15/2015 12:32 PM  Pager [phone] If no answer or after 5 PM call [phone]

## 2015-07-15 NOTE — Progress Notes (Signed)
Alerted MD to pt's hgb 7.7 Orders received.

## 2015-07-15 NOTE — Progress Notes (Signed)
PROGRESS NOTE    EULALEE Lester  [id number] DOB: [birth year] DOA: 07/14/2015 PCP: Pcp Not In System  Outpatient Specialists: None  Brief Narrative:  On 07/14/2015 by Dr. Gilles Chiquito Jamie Lester is a 82 y.o. female with medical history significant of dementia, HTN, HLD who presents for lower GI bleeding. Jamie Lester does not remember the event, but her son, who is her primary caregiver, reports that this morning Jamie Lester was in her normal state of health and was upstairs going to the restroom. Jamie Lester (the son) was heading upstairs when he heard a thump and a yell. He found his mother in the restroom on the floor. She was nonresponsive, diaphoretic and had her eyes rolled back in her head. The toilet bowl was full of blood and stool. She came to and was back to her normal self and she was transported to the hospital by EMS. When EMS arrived, she did have a low blood pressure, but that has now improved. According to Jamie Lester, Jamie Lester has been in her normal state of health. She has complained of no recent issues. She denies chest pain, lightheadedness, dizziness, falling, abdominal pain, blood in the stool, nausea, vomiting, change in bowel habits, change in urinary habits, pain anywhere in her body.   Assessment & Plan   Acute lower GI bleeding/Hematochezia/acute blood loss anemia -Likely diverticular -Patient was also taking low-dose aspirin -Upon admission, hemoglobin was 7.9, dropped to 6.8 -Patient transfused 1 unit PRBC -Hemoglobin currently 9.3 -Gastroenterology consulted and appreciated, recommended supportive care, little role for colonoscopic evaluation and treatment. If patient has brisk bleeding, obtain bleeding scan with possible IR embolization -Continue to monitor CBC  Acute kidney injury versus chronic kidney disease Stage III -Upon admission, creatinine 1.74. Currently 1.42 -Do not have old laboratory values -Continue to monitor BMP  Dementia -Stable,  per husband at bedside  Hypertension -Amlodipine held -BP currently stable  Hyperlipidemia -Atorvastatin held  DVT Prophylaxis  SCDs  Code Status: Full  Family Communication: Husband at bedside  Disposition Plan: Admitted. Continue to monitor.   Consultants Gastroenterology, Dr. Cristina Gong  Procedures  None  Antibiotics   Anti-infectives    None      Subjective:   Jamie Lester seen and examined today.  Patient does have dementia.  Stated, "I was dead yesterday."  Currently denies chest pain, shortness of breath, abdominal pain, nausea or vomiting. Does not recall seeing more blood in her stool. Per RN, patient did have bloody stools overnight.  Objective:   Filed Vitals:   07/14/15 2330 07/14/15 2355 07/15/15 0234 07/15/15 0454  BP: 150/80 156/84 183/82 155/60  Pulse: 75 72 70 82  Temp: 98.4 F (36.9 C) 98.4 F (36.9 C) 97.9 F (36.6 C) 98.3 F (36.8 C)  TempSrc: Oral Oral Oral Axillary  Resp:  16  20  SpO2: 100% 100% 98% 100%    Intake/Output Summary (Last 24 hours) at 07/15/15 0901 Last data filed at 07/15/15 0600  Gross per 24 hour  Intake 654.58 ml  Output      0 ml  Net 654.58 ml   There were no vitals filed for this visit.  Exam  General: Well developed, well nourished, NAD  HEENT: NCAT, mucous membranes moist.   Cardiovascular: S1 S2 auscultated, no rubs, murmurs or gallops. Regular rate and rhythm.  Respiratory: Clear to auscultation bilaterally   Abdomen: Soft, nontender, nondistended, + bowel sounds  Extremities: warm dry without cyanosis clubbing or edema  Neuro: AAOx2, nonfocal  Psych:  Pleasant   Data Reviewed: I have personally reviewed following labs and imaging studies  CBC:  Recent Labs Lab 07/14/15 0914 07/14/15 1540 07/14/15 2030 07/15/15 0532  WBC 7.3 7.2 7.1 9.4  NEUTROABS 4.4  --   --   --   HGB 7.9* 7.0* 6.8* 9.3*  HCT 23.7* 20.9* 20.6* 27.1*  MCV 87.8 85.7 88.4 86.3  PLT 287 254 252 [id number]   Basic  Metabolic Panel:  Recent Labs Lab 07/14/15 0914 07/15/15 0532  NA 141 140  K 4.4 4.3  CL 117* 114*  CO2 20* 16*  GLUCOSE 134* 100*  BUN 36* 31*  CREATININE 1.74* 1.42*  CALCIUM 8.9 8.9   GFR: CrCl cannot be calculated (Unknown ideal weight.). Liver Function Tests:  Recent Labs Lab 07/14/15 0914  AST 16  ALT 12*  ALKPHOS 47  BILITOT 0.3  PROT 6.3*  ALBUMIN 3.3*   No results for input(s): LIPASE, AMYLASE in the last 168 hours. No results for input(s): AMMONIA in the last 168 hours. Coagulation Profile:  Recent Labs Lab 07/14/15 0914  INR 1.22   Cardiac Enzymes: No results for input(s): CKTOTAL, CKMB, CKMBINDEX, TROPONINI in the last 168 hours. BNP (last 3 results) No results for input(s): PROBNP in the last 8760 hours. HbA1C:  Recent Labs  07/14/15 1540  HGBA1C 6.2*   CBG:  Recent Labs Lab 07/15/15 0738  GLUCAP 90   Lipid Profile: No results for input(s): CHOL, HDL, LDLCALC, TRIG, CHOLHDL, LDLDIRECT in the last 72 hours. Thyroid Function Tests: No results for input(s): TSH, T4TOTAL, FREET4, T3FREE, THYROIDAB in the last 72 hours. Anemia Panel: No results for input(s): VITAMINB12, FOLATE, FERRITIN, TIBC, IRON, RETICCTPCT in the last 72 hours. Urine analysis: No results found for: COLORURINE, APPEARANCEUR, LABSPEC, PHURINE, GLUCOSEU, HGBUR, BILIRUBINUR, KETONESUR, PROTEINUR, UROBILINOGEN, NITRITE, LEUKOCYTESUR Sepsis Labs: @LABRCNTIP (procalcitonin:4,lacticidven:4)  )No results found for this or any previous visit (from the past 240 hour(s)).    Radiology Studies: Ct Head Wo Contrast  07/14/2015  CLINICAL DATA:  Recent syncopal event following bloody bowel movement EXAM: CT HEAD WITHOUT CONTRAST TECHNIQUE: Contiguous axial images were obtained from the base of the skull through the vertex without intravenous contrast. COMPARISON:  None. FINDINGS: The bony calvarium is intact. Mild atrophic changes and chronic white matter ischemic changes are seen.  No findings to suggest acute hemorrhage, acute infarction or space-occupying mass lesion are noted. IMPRESSION: Chronic atrophic and ischemic changes. No acute abnormality is noted. Electronically Signed   By: Inez Catalina M.D.   On: 07/14/2015 10:05     Scheduled Meds: . sodium chloride   Intravenous Once  . antiseptic oral rinse  7 mL Mouth Rinse q12n4p  . chlorhexidine  15 mL Mouth Rinse BID   Continuous Infusions: . pantoprozole (PROTONIX) infusion 8 mg/hr (07/14/15 2200)     LOS: 1 day   Time Spent in minutes   30 minutes  Lawrie Tunks D.O. on 07/15/2015 at 9:01 AM  Between 7am to 7pm - Pager - [phone]  After 7pm go to [email or web address] - password [email or web address]  And look for the night coverage person covering for me after hours  [facility] Office  [phone]

## 2015-07-16 LAB — BASIC METABOLIC PANEL
ANION GAP: 9 (ref 5–15)
BUN: 28 mg/dL — AB (ref 6–20)
CHLORIDE: 111 mmol/L (ref 101–111)
CO2: 18 mmol/L — AB (ref 22–32)
Calcium: 9.3 mg/dL (ref 8.9–10.3)
Creatinine, Ser: 1.48 mg/dL — ABNORMAL HIGH (ref 0.44–1.00)
GFR calc non Af Amer: 31 mL/min — ABNORMAL LOW (ref >60)
GFR, EST AFRICAN AMERICAN: 36 mL/min — AB (ref >60)
GLUCOSE: 103 mg/dL — AB (ref 65–99)
POTASSIUM: 4.2 mmol/L (ref 3.5–5.1)
Sodium: 138 mmol/L (ref 135–145)

## 2015-07-16 LAB — CBC
HEMATOCRIT: 27.9 % — AB (ref 36.0–46.0)
HEMOGLOBIN: 9.7 g/dL — AB (ref 12.0–15.0)
MCH: 29 pg (ref 26.0–34.0)
MCHC: 34.8 g/dL (ref 30.0–36.0)
MCV: 83.3 fL (ref 78.0–100.0)
Platelets: 220 10*3/uL (ref 150–400)
RBC: 3.35 MIL/uL — AB (ref 3.87–5.11)
RDW: 14.5 % (ref 11.5–15.5)
WBC: 6.6 10*3/uL (ref 4.0–10.5)

## 2015-07-16 LAB — GLUCOSE, CAPILLARY: Glucose-Capillary: 91 mg/dL (ref 65–99)

## 2015-07-16 MED ORDER — POLYETHYLENE GLYCOL 3350 17 G PO PACK
17.0000 g | PACK | Freq: Two times a day (BID) | ORAL | Status: DC
  Administered 2015-07-16 – 2015-07-18 (×4): 17 g via ORAL
  Filled 2015-07-16 (×4): qty 1

## 2015-07-16 MED ORDER — AMLODIPINE BESYLATE 2.5 MG PO TABS
2.5000 mg | ORAL_TABLET | Freq: Every day | ORAL | Status: DC
  Administered 2015-07-16 – 2015-07-18 (×3): 2.5 mg via ORAL
  Filled 2015-07-16 (×3): qty 1

## 2015-07-16 MED ORDER — ATORVASTATIN CALCIUM 40 MG PO TABS
40.0000 mg | ORAL_TABLET | Freq: Every day | ORAL | Status: DC
  Administered 2015-07-16 – 2015-07-17 (×2): 40 mg via ORAL
  Filled 2015-07-16 (×3): qty 1

## 2015-07-16 NOTE — Progress Notes (Signed)
PROGRESS NOTE    Jamie Lester  [id number] DOB: 12-10-1928 DOA: 07/14/2015 PCP: Pcp Not In System  Outpatient Specialists: None  Brief Narrative:  On 07/14/2015 by Dr. Gilles Chiquito SHERRYL AMBROSI is a 82 y.o. female with medical history significant of dementia, HTN, HLD who presents for lower GI bleeding. Jamie Lester does not remember the event, but her son, who is her primary caregiver, reports that this morning Jamie Lester was in her normal state of health and was upstairs going to the restroom. [name] (the son) was heading upstairs when he heard a thump and a yell. He found his mother in the restroom on the floor. She was nonresponsive, diaphoretic and had her eyes rolled back in her head. The toilet bowl was full of blood and stool. She came to and was back to her normal self and she was transported to the hospital by EMS. When EMS arrived, she did have a low blood pressure, but that has now improved. According to [place], Jamie Lester has been in her normal state of health. She has complained of no recent issues. She denies chest pain, lightheadedness, dizziness, falling, abdominal pain, blood in the stool, nausea, vomiting, change in bowel habits, change in urinary habits, pain anywhere in her body.   Assessment & Plan   Acute lower GI bleeding/Hematochezia/acute blood loss anemia -Likely diverticular -Patient was also taking low-dose aspirin -Upon admission, hemoglobin was 7.9, dropped to 6.8 -Patient transfused 2 unit PRBC during admission (as Hb dropped to 7.7) -Hemoglobin currently 9.7 -Gastroenterology consulted and appreciated, recommended supportive care, little role for colonoscopic evaluation and treatment. If patient has brisk bleeding, obtain bleeding scan with possible IR embolization -No further bleeding overnight. -Continue to monitor CBC  Acute kidney injury versus chronic kidney disease Stage III -Upon admission, creatinine 1.74. Currently 1.48 -Do not  have old laboratory values -Continue to monitor BMP  Dementia -Patient had some sundowning overnight, pulled out IVs.  -Will continue to monitor closely.   Hypertension -Amlodipine held, will restart -BP currently stable  Hyperlipidemia -Atorvastatin held, will restart  DVT Prophylaxis  SCDs  Code Status: Full  Family Communication: None at bedside  Disposition Plan: Admitted. Continue to monitor. Possibly discharge in 24 hours.  Consultants Gastroenterology, Dr. Cristina Gong  Procedures  None  Antibiotics   Anti-infectives    None      Subjective:   Jamie Lester seen and examined today.  Patient does have dementia. Currently denies chest pain, shortness of breath, abdominal pain, nausea or vomiting. Does not recall seeing more blood in her stool. Per RN, patient did not have bloody stools overnight.  Objective:   Filed Vitals:   07/15/15 2120 07/15/15 2215 07/16/15 0630 07/16/15 0838  BP: 192/107 170/78 180/70 170/70  Pulse:  89 85   Temp:  98.4 F (36.9 C) 98.7 F (37.1 C)   TempSrc:  Oral Oral   Resp:  20 18   SpO2:  100% 100%     Intake/Output Summary (Last 24 hours) at 07/16/15 1009 Last data filed at 07/15/15 2336  Gross per 24 hour  Intake   1495 ml  Output      0 ml  Net   1495 ml   There were no vitals filed for this visit.  Exam  General: Well developed, well nourished, NAD  HEENT: NCAT, mucous membranes moist.   Cardiovascular: S1 S2 auscultated, no murmurs, RRR  Respiratory: Clear to auscultation bilaterally   Abdomen: Soft, nontender, nondistended, + bowel sounds  Extremities: warm dry without cyanosis clubbing or edema  Neuro: AAOx2, nonfocal  Psych: Pleasant, confused   Data Reviewed: I have personally reviewed following labs and imaging studies  CBC:  Recent Labs Lab 07/14/15 0914  07/14/15 2030 07/15/15 0532 07/15/15 1139 07/15/15 1523 07/16/15 0835  WBC 7.3  < > 7.1 9.4 9.7 9.9 6.6  NEUTROABS 4.4  --   --    --   --   --   --   HGB 7.9*  < > 6.8* 9.3* 8.7* 7.7* 9.7*  HCT 23.7*  < > 20.6* 27.1* 25.6* 22.3* 27.9*  MCV 87.8  < > 88.4 86.3 85.0 84.5 83.3  PLT 287  < > 252 222 247 231 220  < > = values in this interval not displayed. Basic Metabolic Panel:  Recent Labs Lab 07/14/15 0914 07/15/15 0532 07/16/15 0835  NA 141 140 138  K 4.4 4.3 4.2  CL 117* 114* 111  CO2 20* 16* 18*  GLUCOSE 134* 100* 103*  BUN 36* 31* 28*  CREATININE 1.74* 1.42* 1.48*  CALCIUM 8.9 8.9 9.3   GFR: CrCl cannot be calculated (Unknown ideal weight.). Liver Function Tests:  Recent Labs Lab 07/14/15 0914  AST 16  ALT 12*  ALKPHOS 47  BILITOT 0.3  PROT 6.3*  ALBUMIN 3.3*   No results for input(s): LIPASE, AMYLASE in the last 168 hours. No results for input(s): AMMONIA in the last 168 hours. Coagulation Profile:  Recent Labs Lab 07/14/15 0914  INR 1.22   Cardiac Enzymes: No results for input(s): CKTOTAL, CKMB, CKMBINDEX, TROPONINI in the last 168 hours. BNP (last 3 results) No results for input(s): PROBNP in the last 8760 hours. HbA1C:  Recent Labs  07/14/15 1540  HGBA1C 6.2*   CBG:  Recent Labs Lab 07/15/15 0738 07/16/15 0718  GLUCAP 90 91   Lipid Profile: No results for input(s): CHOL, HDL, LDLCALC, TRIG, CHOLHDL, LDLDIRECT in the last 72 hours. Thyroid Function Tests: No results for input(s): TSH, T4TOTAL, FREET4, T3FREE, THYROIDAB in the last 72 hours. Anemia Panel: No results for input(s): VITAMINB12, FOLATE, FERRITIN, TIBC, IRON, RETICCTPCT in the last 72 hours. Urine analysis: No results found for: COLORURINE, APPEARANCEUR, LABSPEC, PHURINE, GLUCOSEU, HGBUR, BILIRUBINUR, KETONESUR, PROTEINUR, UROBILINOGEN, NITRITE, LEUKOCYTESUR Sepsis Labs: @LABRCNTIP (procalcitonin:4,lacticidven:4)  )No results found for this or any previous visit (from the past 240 hour(s)).    Radiology Studies: No results found.   Scheduled Meds: . sodium chloride   Intravenous Once  .  antiseptic oral rinse  7 mL Mouth Rinse q12n4p  . chlorhexidine  15 mL Mouth Rinse BID  . sodium chloride flush  3 mL Intravenous Q12H   Continuous Infusions:     LOS: 2 days   Time Spent in minutes   30 minutes  Valentine Barney D.O. on 07/16/2015 at 10:09 AM  Between 7am to 7pm - Pager - [phone]  After 7pm go to [email or web address] - password [email or web address]  And look for the night coverage person covering for me after hours  [facility] Office  [phone]

## 2015-07-16 NOTE — Progress Notes (Addendum)
This shift both IV sites removed  Due to malposition and occlusion. Pt becoming increasingly confused, combative and climbing out of bed, repeated reorienting needed.  Called son  for sitter service but no answer. Pt refused any meds offered and refused IV restart by this RN  And IV nurse.. was advised by patient to" wait until the morning". PT refusing a.m. labs, PO meds and 3rd request for IV restart. [place] notified. Lab will attempt to draw at 10 am

## 2015-07-16 NOTE — Progress Notes (Signed)
Per discussion with nurse, patient had multiple bloody bowel movements (maroon) yesterday but none through the night or so far today.  The patient's hemoglobin dropped to 7.7, but came up appropriately to 9.7 (probably a spuriously high level),following transfusion of 1 unit of packed cells yesterday.  Today, the patient is lying in bed in no distress, and vital signs are unremarkable.  Impression:  Almost certainly this is a diverticular bleed (pancolonic diverticulosis on previous colonoscopy October 2013). At the moment, the patient seems to have stopped bleeding.  Plan:   1.  Gentle laxation to try to purge blood out of the GI tract, to get a clear idea as to whether and when she is having bleeding. I have ordered MiraLAX.  2.  Continue monitoring and supportive care. She does not appear to be bleeding briskly enough to justify a bleeding scan or embolization, and colonoscopy would be unlikely to be therapeutically effective or diagnostically revealing.  Cleotis Nipper, M.D. Pager [phone] If no answer or after 5 PM call [phone]

## 2015-07-17 LAB — BASIC METABOLIC PANEL
Anion gap: 6 (ref 5–15)
BUN: 27 mg/dL — ABNORMAL HIGH (ref 6–20)
CALCIUM: 9.2 mg/dL (ref 8.9–10.3)
CHLORIDE: 113 mmol/L — AB (ref 101–111)
CO2: 20 mmol/L — AB (ref 22–32)
CREATININE: 1.54 mg/dL — AB (ref 0.44–1.00)
GFR calc non Af Amer: 30 mL/min — ABNORMAL LOW (ref >60)
GFR, EST AFRICAN AMERICAN: 35 mL/min — AB (ref >60)
GLUCOSE: 96 mg/dL (ref 65–99)
Potassium: 4.4 mmol/L (ref 3.5–5.1)
Sodium: 139 mmol/L (ref 135–145)

## 2015-07-17 LAB — TYPE AND SCREEN
ABO/RH(D): O POS
ANTIBODY SCREEN: NEGATIVE
UNIT DIVISION: 0
Unit division: 0

## 2015-07-17 LAB — CBC
HEMATOCRIT: 27.3 % — AB (ref 36.0–46.0)
HEMOGLOBIN: 9.3 g/dL — AB (ref 12.0–15.0)
MCH: 29.3 pg (ref 26.0–34.0)
MCHC: 34.1 g/dL (ref 30.0–36.0)
MCV: 86.1 fL (ref 78.0–100.0)
Platelets: 235 10*3/uL (ref 150–400)
RBC: 3.17 MIL/uL — ABNORMAL LOW (ref 3.87–5.11)
RDW: 14.8 % (ref 11.5–15.5)
WBC: 6.5 10*3/uL (ref 4.0–10.5)

## 2015-07-17 LAB — GLUCOSE, CAPILLARY: Glucose-Capillary: 99 mg/dL (ref 65–99)

## 2015-07-17 NOTE — Care Management Important Message (Signed)
Important Message  Patient Details  Name: Jamie Lester MRN: [id number] Date of Birth: 30-Nov-1928   Medicare Important Message Given:  Yes    Camillo Flaming 07/17/2015, 10:54 AMImportant Message  Patient Details  Name: Jamie Lester MRN: [id number] Date of Birth: Sep 14, 1928   Medicare Important Message Given:  Yes    Camillo Flaming 07/17/2015, 10:54 AM

## 2015-07-17 NOTE — Progress Notes (Signed)
PROGRESS NOTE    Jamie Lester  [id number] DOB: 1928-10-11 DOA: 07/14/2015 PCP: Pcp Not In System  Outpatient Specialists: None  Brief Narrative:  On 07/14/2015 by Dr. Gilles Chiquito Jamie Lester is a 82 y.o. female with medical history significant of dementia, HTN, HLD who presents for lower GI bleeding. Jamie Lester does not remember the event, but her son, who is her primary caregiver, reports that this morning Jamie Lester was in her normal state of health and was upstairs going to the restroom. [name] (the son) was heading upstairs when he heard a thump and a yell. He found his mother in the restroom on the floor. She was nonresponsive, diaphoretic and had her eyes rolled back in her head. The toilet bowl was full of blood and stool. She came to and was back to her normal self and she was transported to the hospital by EMS. When EMS arrived, she did have a low blood pressure, but that has now improved. According to Jamie Lester, Jamie Lester has been in her normal state of health. She has complained of no recent issues. She denies chest pain, lightheadedness, dizziness, falling, abdominal pain, blood in the stool, nausea, vomiting, change in bowel habits, change in urinary habits, pain anywhere in her body.   Assessment & Plan   Acute lower GI bleeding/Hematochezia/acute blood loss anemia -Likely diverticular -Patient was also taking low-dose aspirin -Upon admission, hemoglobin was 7.9, dropped to 6.8 -Patient transfused 2 unit PRBC during admission (as Hb dropped to 7.7) -Hemoglobin currently 9.3 -Gastroenterology consulted and appreciated, recommended supportive care, little role for colonoscopic evaluation and treatment. If patient has brisk bleeding, obtain bleeding scan with possible IR embolization -No further bleeding over the past 48 hours -Continue to monitor CBC -Pending further recommendations from gastroenterology  Acute kidney injury versus chronic kidney disease  Stage III -Upon admission, creatinine 1.74. Currently 1.54 -Do not have old laboratory values -Continue to monitor BMP  Dementia -Overnight, patient's family stayed. -Will continue to monitor closely.   Hypertension -Continue Amlodipine  -BP currently stable  Hyperlipidemia -Continue Atorvastatin  DVT Prophylaxis  SCDs  Code Status: Full  Family Communication: Family at bedside  Disposition Plan: Admitted. Continue to monitor. Discharge pending recommendations from gastroenterology   Consultants Gastroenterology, Dr. Cristina Gong  Procedures  None  Antibiotics   Anti-infectives    None      Subjective:   Jamie Lester seen and examined today.  Patient does have dementia. Currently denies chest pain, shortness of breath, abdominal pain, nausea or vomiting. Does not recall seeing more blood in her stool. Per RN, patient did not have bloody stools overnight.  Objective:   Filed Vitals:   07/16/15 0838 07/16/15 1300 07/16/15 2200 07/17/15 0420  BP: 170/70 113/67 168/71 136/68  Pulse:  88 80 79  Temp:  97.3 F (36.3 C) 98.1 F (36.7 C) 98.1 F (36.7 C)  TempSrc:  Oral Oral Axillary  Resp:  18 18 18   SpO2:  99% 98% 100%    Intake/Output Summary (Last 24 hours) at 07/17/15 1037 Last data filed at 07/16/15 1700  Gross per 24 hour  Intake    480 ml  Output      0 ml  Net    480 ml   There were no vitals filed for this visit.  Exam  General: Well developed, well nourished, no distress  HEENT: NCAT, mucous membranes moist.   Cardiovascular: S1 S2 auscultated, no murmurs, RRR  Respiratory: Clear to auscultation bilaterally  Abdomen: Soft, nontender, nondistended, + bowel sounds  Extremities: warm dry without cyanosis clubbing or edema  Neuro: AAOx2, nonfocal  Psych: Pleasant   Data Reviewed: I have personally reviewed following labs and imaging studies  CBC:  Recent Labs Lab 07/14/15 0914  07/15/15 0532 07/15/15 1139 07/15/15 1523  07/16/15 0835 07/17/15 0531  WBC 7.3  < > 9.4 9.7 9.9 6.6 6.5  NEUTROABS 4.4  --   --   --   --   --   --   HGB 7.9*  < > 9.3* 8.7* 7.7* 9.7* 9.3*  HCT 23.7*  < > 27.1* 25.6* 22.3* 27.9* 27.3*  MCV 87.8  < > 86.3 85.0 84.5 83.3 86.1  PLT 287  < > 222 247 231 220 235  < > = values in this interval not displayed. Basic Metabolic Panel:  Recent Labs Lab 07/14/15 0914 07/15/15 0532 07/16/15 0835 07/17/15 0531  NA 141 140 138 139  K 4.4 4.3 4.2 4.4  CL 117* 114* 111 113*  CO2 20* 16* 18* 20*  GLUCOSE 134* 100* 103* 96  BUN 36* 31* 28* 27*  CREATININE 1.74* 1.42* 1.48* 1.54*  CALCIUM 8.9 8.9 9.3 9.2   GFR: CrCl cannot be calculated (Unknown ideal weight.). Liver Function Tests:  Recent Labs Lab 07/14/15 0914  AST 16  ALT 12*  ALKPHOS 47  BILITOT 0.3  PROT 6.3*  ALBUMIN 3.3*   No results for input(s): LIPASE, AMYLASE in the last 168 hours. No results for input(s): AMMONIA in the last 168 hours. Coagulation Profile:  Recent Labs Lab 07/14/15 0914  INR 1.22   Cardiac Enzymes: No results for input(s): CKTOTAL, CKMB, CKMBINDEX, TROPONINI in the last 168 hours. BNP (last 3 results) No results for input(s): PROBNP in the last 8760 hours. HbA1C:  Recent Labs  07/14/15 1540  HGBA1C 6.2*   CBG:  Recent Labs Lab 07/15/15 0738 07/16/15 0718 07/17/15 0741  GLUCAP 90 91 99   Lipid Profile: No results for input(s): CHOL, HDL, LDLCALC, TRIG, CHOLHDL, LDLDIRECT in the last 72 hours. Thyroid Function Tests: No results for input(s): TSH, T4TOTAL, FREET4, T3FREE, THYROIDAB in the last 72 hours. Anemia Panel: No results for input(s): VITAMINB12, FOLATE, FERRITIN, TIBC, IRON, RETICCTPCT in the last 72 hours. Urine analysis: No results found for: COLORURINE, APPEARANCEUR, LABSPEC, PHURINE, GLUCOSEU, HGBUR, BILIRUBINUR, KETONESUR, PROTEINUR, UROBILINOGEN, NITRITE, LEUKOCYTESUR Sepsis Labs: @LABRCNTIP (procalcitonin:4,lacticidven:4)  )No results found for this or any  previous visit (from the past 240 hour(s)).    Radiology Studies: No results found.   Scheduled Meds: . sodium chloride   Intravenous Once  . amLODipine  2.5 mg Oral Daily  . antiseptic oral rinse  7 mL Mouth Rinse q12n4p  . atorvastatin  40 mg Oral Daily  . chlorhexidine  15 mL Mouth Rinse BID  . polyethylene glycol  17 g Oral BID  . sodium chloride flush  3 mL Intravenous Q12H   Continuous Infusions:     LOS: 3 days   Time Spent in minutes   30 minutes  Othmar Ringer D.O. on 07/17/2015 at 10:37 AM  Between 7am to 7pm - Pager - [phone]  After 7pm go to [email or web address] - password [email or web address]  And look for the night coverage person covering for me after hours  [facility] Office  [phone]

## 2015-07-17 NOTE — Progress Notes (Signed)
Subjective: No further GI bleeding, per patient or family recollection, in over 48 hours. No abdominal pain. Tolerating soft diet.  Objective: Vital signs in last 24 hours: Temp:  [98.1 F (36.7 C)-98.9 F (37.2 C)] 98.9 F (37.2 C) (04/24 1427) Pulse Rate:  [79-82] 82 (04/24 1427) Resp:  [18] 18 (04/24 1427) BP: (136-168)/(68-78) 149/78 mmHg (04/24 1427) SpO2:  [96 %-100 %] 96 % (04/24 1427) Weight change:  Last BM Date: 07/15/15  PE: GEN:  NAD, elderly, pleasant  Lab Results: CBC    Component Value Date/Time   WBC 6.5 07/17/2015 0531   RBC 3.17* 07/17/2015 0531   HGB 9.3* 07/17/2015 0531   HCT 27.3* 07/17/2015 0531   PLT 235 07/17/2015 0531   MCV 86.1 07/17/2015 0531   MCH 29.3 07/17/2015 0531   MCHC 34.1 07/17/2015 0531   RDW 14.8 07/17/2015 0531   LYMPHSABS 2.5 07/14/2015 0914   MONOABS 0.3 07/14/2015 0914   EOSABS 0.0 07/14/2015 0914   BASOSABS 0.0 07/14/2015 0914   CMP     Component Value Date/Time   NA 139 07/17/2015 0531   K 4.4 07/17/2015 0531   CL 113* 07/17/2015 0531   CO2 20* 07/17/2015 0531   GLUCOSE 96 07/17/2015 0531   BUN 27* 07/17/2015 0531   CREATININE 1.54* 07/17/2015 0531   CALCIUM 9.2 07/17/2015 0531   PROT 6.3* 07/14/2015 0914   ALBUMIN 3.3* 07/14/2015 0914   AST 16 07/14/2015 0914   ALT 12* 07/14/2015 0914   ALKPHOS 47 07/14/2015 0914   BILITOT 0.3 07/14/2015 0914   GFRNONAA 30* 07/17/2015 0531   GFRAA 35* 07/17/2015 0531   Assessment:  1.  Acute blood loss anemia, likely from #2 below, stable. 2.  Hematochezia, likely diverticular, seemingly resolved.  Plan:  1.  Advance diet. 2.  Continued supportive care. 3.  If patient continues to do well, tolerate diet, and no further bleeding, would consider discharge home tomorrow from GI perspective. 4.  [facility] will follow.   Landry Dyke 07/17/2015, 3:26 PM   Pager [phone] If no answer or after 5 PM call [phone]

## 2015-07-17 NOTE — Care Management Note (Signed)
Case Management Note  Patient Details  Name: Jamie Lester MRN: [id number] Date of Birth: December 21, 1928  Subjective/Objective: 82 y/o f admitted w/Acute lower GIB. From home. PT cons-await recc.                   Action/Plan:d/c plan home.   Expected Discharge Date:   (UNKNOWN)               Expected Discharge Plan:  [place]  In-House Referral:     Discharge planning Services  CM Consult  Post Acute Care Choice:    Choice offered to:     DME Arranged:    DME Agency:     HH Arranged:    [place] Agency:     Status of Service:  In process, will continue to follow  Medicare Important Message Given:  Yes Date Medicare IM Given:    Medicare IM give by:    Date Additional Medicare IM Given:    Additional Medicare Important Message give by:     If discussed at [place] of Stay Meetings, dates discussed:    Additional Comments:  Dessa Phi, RN 07/17/2015, 3:15 PM

## 2015-07-18 LAB — BASIC METABOLIC PANEL
ANION GAP: 7 (ref 5–15)
BUN: 29 mg/dL — AB (ref 6–20)
CHLORIDE: 113 mmol/L — AB (ref 101–111)
CO2: 21 mmol/L — AB (ref 22–32)
Calcium: 8.8 mg/dL — ABNORMAL LOW (ref 8.9–10.3)
Creatinine, Ser: 1.73 mg/dL — ABNORMAL HIGH (ref 0.44–1.00)
GFR calc Af Amer: 30 mL/min — ABNORMAL LOW (ref >60)
GFR, EST NON AFRICAN AMERICAN: 26 mL/min — AB (ref >60)
GLUCOSE: 101 mg/dL — AB (ref 65–99)
POTASSIUM: 4.3 mmol/L (ref 3.5–5.1)
Sodium: 141 mmol/L (ref 135–145)

## 2015-07-18 LAB — GLUCOSE, CAPILLARY: Glucose-Capillary: 98 mg/dL (ref 65–99)

## 2015-07-18 LAB — CBC
HEMATOCRIT: 25.7 % — AB (ref 36.0–46.0)
HEMOGLOBIN: 8.6 g/dL — AB (ref 12.0–15.0)
MCH: 28.4 pg (ref 26.0–34.0)
MCHC: 33.5 g/dL (ref 30.0–36.0)
MCV: 84.8 fL (ref 78.0–100.0)
PLATELETS: 244 10*3/uL (ref 150–400)
RBC: 3.03 MIL/uL — AB (ref 3.87–5.11)
RDW: 14.5 % (ref 11.5–15.5)
WBC: 6.6 10*3/uL (ref 4.0–10.5)

## 2015-07-18 NOTE — Progress Notes (Signed)
Pt discharged from the unit via wheelchair. Discharge instructions were reviewed with the family members at bedside. Informed pt and family members that the pt needs to follow up with PCP in 1 week since PCP was not on file. The family members or pt do not have any further questions at this time.  Zakara Parkey W Carlous Olivares, RN

## 2015-07-18 NOTE — Discharge Instructions (Signed)

## 2015-07-18 NOTE — Evaluation (Signed)
Physical Therapy Evaluation Patient Details Name: Jamie Lester MRN: [id number] DOB: 12/20/1928 Today's Date: 07/18/2015   History of Present Illness  82 y.o. female with past medical history of dementia, HTN, HLD who presents for lower GI bleeding  Clinical Impression  Pt admitted with above diagnosis. Pt currently with functional limitations due to the deficits listed below (see PT Problem List).  Pt will benefit from skilled PT to increase their independence and safety with mobility to allow discharge to the venue listed below.   Pt assisted with ambulating in hallway and very unsteady.  Pt requiring external support for balance as well.  RN notified of 24/7 supervision for safety at home due to high fall risk.     Follow Up Recommendations Supervision/Assistance - 24 hour    Equipment Recommendations  None recommended by PT    Recommendations for Other Services       Precautions / Restrictions Precautions Precautions: Fall Restrictions Weight Bearing Restrictions: No      Mobility  Bed Mobility Overal bed mobility: Needs Assistance Bed Mobility: Supine to Sit     Supine to sit: Supervision     General bed mobility comments: verbal cues for technique  Transfers Overall transfer level: Needs assistance Equipment used: 1 person hand held assist Transfers: Sit to/from Stand Sit to Stand: Min guard         General transfer comment: verbal cues for technique  Ambulation/Gait Ambulation/Gait assistance: Min assist Ambulation Distance (Feet): 300 Feet Assistive device: 1 person hand held assist Gait Pattern/deviations: Step-through pattern;Narrow base of support;Drifts right/left;Decreased stride length     General Gait Details: HHA provided for support, pt with multiple instances of unsteady gait requiring assist to correct  Stairs            Wheelchair Mobility    Modified Rankin (Stroke Patients Only)       Balance Overall balance  assessment: Needs assistance;History of Falls         Standing balance support: Single extremity supported;During functional activity Standing balance-Leahy Scale: Poor Standing balance comment: requires min external assist for balance with ambulation                             Pertinent Vitals/Pain Pain Assessment: No/denies pain    Home Living Family/patient expects to be discharged to:: Private residence Living Arrangements: Spouse/significant other;Children (son)   Type of Home: House       Home Layout: Two level   Additional Comments: pt poor historian, lives with son who was not present    Prior Function Level of Independence: Needs assistance   Gait / Transfers Assistance Needed: ambulatory, likely supervision level           Hand Dominance        Extremity/Trunk Assessment   Upper Extremity Assessment: Overall WFL for tasks assessed           Lower Extremity Assessment: Overall WFL for tasks assessed         Communication   Communication: No difficulties  Cognition Arousal/Alertness: Awake/alert Behavior During Therapy: WFL for tasks assessed/performed Overall Cognitive Status: History of cognitive impairments - at baseline (hx dementia)                      General Comments      Exercises        Assessment/Plan    PT Assessment Patient needs continued PT services  PT Diagnosis Difficulty walking   PT Problem List Decreased strength;Decreased activity tolerance;Decreased mobility;Decreased safety awareness;Decreased knowledge of use of DME  PT Treatment Interventions DME instruction;Gait training;Patient/family education;Functional mobility training;Therapeutic activities;Therapeutic exercise   PT Goals (Current goals can be found in the Care Plan section) Acute Rehab PT Goals PT Goal Formulation: With patient Time For Goal Achievement: 07/25/15 Potential to Achieve Goals: Good    Frequency Min 3X/week    Barriers to discharge        Co-evaluation               End of Session Equipment Utilized During Treatment: Gait belt Activity Tolerance: Patient tolerated treatment well Patient left: in bed;with call bell/phone within reach;with bed alarm set Nurse Communication: Mobility status         Time: [id number] PT Time Calculation (min) (ACUTE ONLY): 14 min   Charges:   PT Evaluation $PT Eval Low Complexity: 1 Procedure     PT G Codes:        Kelvin Sennett,KATHrine E 07/18/2015, 11:57 AM Carmelia Bake, PT, DPT 07/18/2015 Pager: [phone]

## 2015-07-18 NOTE — Care Management Note (Signed)
Case Management Note  Patient Details  Name: Jamie Lester MRN: [id number] Date of Birth: 1928/08/18  Subjective/Objective:   Patient has family @ home. No HHC needed, & no orders.                 Action/Plan:d/c home no needs.   Expected Discharge Date:   (UNKNOWN)               Expected Discharge Plan:  Home/Self Care  In-House Referral:     Discharge planning Services  CM Consult  Post Acute Care Choice:    Choice offered to:     DME Arranged:    DME Agency:     HH Arranged:    [place] Agency:     Status of Service:  Completed, signed off  Medicare Important Message Given:  Yes Date Medicare IM Given:    Medicare IM give by:    Date Additional Medicare IM Given:    Additional Medicare Important Message give by:     If discussed at [place] of Stay Meetings, dates discussed:    Additional Comments:  Dessa Phi, RN 07/18/2015, 10:34 AM

## 2015-07-18 NOTE — Discharge Summary (Signed)
Physician Discharge Summary  Jamie Lester [id number] DOB: 29-Sep-1928 DOA: 07/14/2015  PCP: Pcp Not In System  Admit date: 07/14/2015 Discharge date: 07/18/2015  Time spent: 45 minutes  Recommendations for Outpatient Follow-up:  Patient will be discharged to home.  Patient will need to follow up with primary care provider within one week of discharge, repeat CBC and BMP.  Follow up with Gastroenterology in 2-3 weeks. Patient should continue medications as prescribed.  Patient should follow a regular diet.   Discharge Diagnoses:  Acute lower GI bleeding/hematochezia/acute blood loss anemia Chronic kidney disease, stage III Dementia  Hypertension  Hyperlipidemia  Discharge Condition: Stable  Diet recommendation: Regular  There were no vitals filed for this visit.  History of present illness:  On 07/14/2015 by Jamie Lester is a 82 y.o. female with medical history significant of dementia, HTN, HLD who presents for lower GI bleeding. Jamie Lester does not remember the event, but her son, who is her primary caregiver, reports that this morning Jamie Lester was in her normal state of health and was upstairs going to the restroom. Jamie (the son) was heading upstairs when he heard a thump and a yell. He found his mother in the restroom on the floor. She was nonresponsive, diaphoretic and had her eyes rolled back in her head. The toilet bowl was full of blood and stool. She came to and was back to her normal self and she was transported to the hospital by EMS. When EMS arrived, she did have a low blood pressure, but that has now improved. According to Jamie Lester, Jamie Lester has been in her normal state of health. She has complained of no recent issues. She denies chest pain, lightheadedness, dizziness, falling, abdominal pain, blood in the stool, nausea, vomiting, change in bowel habits, change in urinary habits, pain anywhere in her body.   Hospital Course:  Acute  lower GI bleeding/Hematochezia/acute blood loss anemia -Likely diverticular -Patient was also taking low-dose aspirin -Upon admission, hemoglobin was 7.9, dropped to 6.8 -Patient transfused 2 unit PRBC during admission (as Hb dropped to 7.7) -Hemoglobin currently 8.6 -Gastroenterology consulted and appreciated, recommended supportive care, little role for colonoscopic evaluation and treatment. If patient has brisk bleeding, obtain bleeding scan with possible IR embolization -No further bleeding over the past 72 hours -Spoke with Dr. Paulita Fujita, who recommended patient be discharged with CBC in later this week and follow up with GI.   Acute kidney injury versus chronic kidney disease Stage III -Upon admission, creatinine 1.74. Currently 1.73 -Do not have old laboratory values -Suspect CKD, stage III -Continue to monitor BMP  Dementia -Overnight, patient's family stayed. -Will continue to monitor closely.   Hypertension -Continue Amlodipine  -BP currently stable  Hyperlipidemia -Continue Atorvastatin  Consultants Gastroenterology, Dr. Cristina Gong and Dr. Paulita Fujita  Procedures   None  Discharge Exam: Filed Vitals:   07/17/15 2041 07/18/15 0542  BP: 141/77 160/77  Pulse: 87 72  Temp: 98.9 F (37.2 C) 98 F (36.7 C)  Resp: 18 18    Exam  General: Well developed, well nourished, no distress  HEENT: NCAT, mucous membranes moist.   Cardiovascular: S1 S2 auscultated, no murmurs, RRR  Respiratory: Clear to auscultation bilaterally  Abdomen: Soft, nontender, nondistended, + bowel sounds  Extremities: warm dry without cyanosis clubbing or edema  Neuro: AAOx2, nonfocal  Psych: Pleasant  Discharge Instructions      Discharge Instructions    Discharge instructions    Complete by:  As directed  Patient will be discharged to home.  Patient will need to follow up with primary care provider within one week of discharge, repeat CBC and BMP.  Follow up with Gastroenterology  in 2-3 weeks. Patient should continue medications as prescribed.  Patient should follow a regular diet.            Medication List    STOP taking these medications        aspirin EC 81 MG tablet      TAKE these medications        amLODipine 2.5 MG tablet  Commonly known as:  NORVASC  Take 2.5 mg by mouth daily.     atorvastatin 40 MG tablet  Commonly known as:  LIPITOR  Take 40 mg by mouth daily.     multivitamin with minerals Tabs tablet  Take 1 tablet by mouth daily.       No Known Allergies Follow-up Information    Follow up with [facility]. Schedule an appointment as soon as possible for a visit in 3 weeks.   Why:  Hospital follow up   Contact information:   [place] [phone]        The results of significant diagnostics from this hospitalization (including imaging, microbiology, ancillary and laboratory) are listed below for reference.    Significant Diagnostic Studies: Ct Head Wo Contrast  07/14/2015  CLINICAL DATA:  Recent syncopal event following bloody bowel movement EXAM: CT HEAD WITHOUT CONTRAST TECHNIQUE: Contiguous axial images were obtained from the base of the skull through the vertex without intravenous contrast. COMPARISON:  None. FINDINGS: The bony calvarium is intact. Mild atrophic changes and chronic white matter ischemic changes are seen. No findings to suggest acute hemorrhage, acute infarction or space-occupying mass lesion are noted. IMPRESSION: Chronic atrophic and ischemic changes. No acute abnormality is noted. Electronically Signed   By: Inez Catalina M.D.   On: 07/14/2015 10:05   US Renal  06/29/2015  CLINICAL DATA:  Stage III chronic renal disease EXAM: RENAL / URINARY TRACT ULTRASOUND COMPLETE COMPARISON:  None. FINDINGS: Right Kidney: Length: 9.2 cm. Echogenicity and renal cortical thickness are within normal limits. No mass, perinephric fluid, or hydronephrosis visualized. No  sonographically demonstrable calculus or ureterectasis. Left Kidney: Length: 9.0 cm. Echogenicity and renal cortical thickness are within normal limits. No perinephric fluid or hydronephrosis visualized. There is a 1.5 x 1.5 x 1.1 cm cyst in the lower pole region. No sonographically demonstrable calculus or ureterectasis. Bladder: Appears normal for degree of bladder distention. IMPRESSION: Small lower pole left renal cyst.  Study otherwise unremarkable. Electronically Signed   By: Lowella Grip III M.D.   On: 06/29/2015 14:01    Microbiology: No results found for this or any previous visit (from the past 240 hour(s)).   Labs: Basic Metabolic Panel:  Recent Labs Lab 07/14/15 0914 07/15/15 0532 07/16/15 0835 07/17/15 0531 07/18/15 0530  NA 141 140 138 139 141  K 4.4 4.3 4.2 4.4 4.3  CL 117* 114* 111 113* 113*  CO2 20* 16* 18* 20* 21*  GLUCOSE 134* 100* 103* 96 101*  BUN 36* 31* 28* 27* 29*  CREATININE 1.74* 1.42* 1.48* 1.54* 1.73*  CALCIUM 8.9 8.9 9.3 9.2 8.8*   Liver Function Tests:  Recent Labs Lab 07/14/15 0914  AST 16  ALT 12*  ALKPHOS 47  BILITOT 0.3  PROT 6.3*  ALBUMIN 3.3*   No results for input(s): LIPASE, AMYLASE in the last 168 hours.  No results for input(s): AMMONIA in the last 168 hours. CBC:  Recent Labs Lab 07/14/15 0914  07/15/15 1139 07/15/15 1523 07/16/15 0835 07/17/15 0531 07/18/15 0530  WBC 7.3  < > 9.7 9.9 6.6 6.5 6.6  NEUTROABS 4.4  --   --   --   --   --   --   HGB 7.9*  < > 8.7* 7.7* 9.7* 9.3* 8.6*  HCT 23.7*  < > 25.6* 22.3* 27.9* 27.3* 25.7*  MCV 87.8  < > 85.0 84.5 83.3 86.1 84.8  PLT 287  < > 247 231 220 235 244  < > = values in this interval not displayed. Cardiac Enzymes: No results for input(s): CKTOTAL, CKMB, CKMBINDEX, TROPONINI in the last 168 hours. BNP: BNP (last 3 results) No results for input(s): BNP in the last 8760 hours.  ProBNP (last 3 results) No results for input(s): PROBNP in the last 8760  hours.  CBG:  Recent Labs Lab 07/15/15 0738 07/16/15 0718 07/17/15 0741 07/18/15 0736  GLUCAP 90 91 99 98       Signed:  Ondra Deboard  [facility] 07/18/2015, 9:56 AM

## 2015-07-27 DIAGNOSIS — D500 Iron deficiency anemia secondary to blood loss (chronic): Secondary | ICD-10-CM | POA: Diagnosis not present

## 2015-08-02 DIAGNOSIS — N183 Chronic kidney disease, stage 3 (moderate): Secondary | ICD-10-CM | POA: Diagnosis not present

## 2015-08-02 DIAGNOSIS — I129 Hypertensive chronic kidney disease with stage 1 through stage 4 chronic kidney disease, or unspecified chronic kidney disease: Secondary | ICD-10-CM | POA: Diagnosis not present

## 2015-08-02 DIAGNOSIS — N2581 Secondary hyperparathyroidism of renal origin: Secondary | ICD-10-CM | POA: Diagnosis not present

## 2015-08-02 DIAGNOSIS — D631 Anemia in chronic kidney disease: Secondary | ICD-10-CM | POA: Diagnosis not present

## 2015-08-11 ENCOUNTER — Other Ambulatory Visit (HOSPITAL_COMMUNITY): Payer: Self-pay | Admitting: *Deleted

## 2015-08-11 NOTE — Discharge Instructions (Signed)
Epoetin Alfa injection What is this medicine? EPOETIN ALFA (e POE e tin AL fa) helps your body make more red blood cells. This medicine is used to treat anemia caused by chronic kidney failure, cancer chemotherapy, or HIV-therapy. It may also be used before surgery if you have anemia. This medicine may be used for other purposes; ask your health care provider or pharmacist if you have questions. What should I tell my health care provider before I take this medicine? They need to know if you have any of these conditions: -blood clotting disorders -cancer patient not on chemotherapy -cystic fibrosis -heart disease, such as angina or heart failure -hemoglobin level of 12 g/dL or greater -high blood pressure -low levels of folate, iron, or vitamin B12 -seizures -an unusual or allergic reaction to erythropoietin, albumin, benzyl alcohol, hamster proteins, other medicines, foods, dyes, or preservatives -pregnant or trying to get pregnant -breast-feeding How should I use this medicine? This medicine is for injection into a vein or under the skin. It is usually given by a health care professional in a hospital or clinic setting. If you get this medicine at home, you will be taught how to prepare and give this medicine. Use exactly as directed. Take your medicine at regular intervals. Do not take your medicine more often than directed. It is important that you put your used needles and syringes in a special sharps container. Do not put them in a trash can. If you do not have a sharps container, call your pharmacist or healthcare provider to get one. Talk to your pediatrician regarding the use of this medicine in children. While this drug may be prescribed for selected conditions, precautions do apply. Overdosage: If you think you have taken too much of this medicine contact a poison control center or emergency room at once. NOTE: This medicine is only for you. Do not share this medicine with  others. What if I miss a dose? If you miss a dose, take it as soon as you can. If it is almost time for your next dose, take only that dose. Do not take double or extra doses. What may interact with this medicine? Do not take this medicine with any of the following medications: -darbepoetin alfa This list may not describe all possible interactions. Give your health care provider a list of all the medicines, herbs, non-prescription drugs, or dietary supplements you use. Also tell them if you smoke, drink alcohol, or use illegal drugs. Some items may interact with your medicine. What should I watch for while using this medicine? Visit your prescriber or health care professional for regular checks on your progress and for the needed blood tests and blood pressure measurements. It is especially important for the doctor to make sure your hemoglobin level is in the desired range, to limit the risk of potential side effects and to give you the best benefit. Keep all appointments for any recommended tests. Check your blood pressure as directed. Ask your doctor what your blood pressure should be and when you should contact him or her. As your body makes more red blood cells, you may need to take iron, folic acid, or vitamin B supplements. Ask your doctor or health care provider which products are right for you. If you have kidney disease continue dietary restrictions, even though this medication can make you feel better. Talk with your doctor or health care professional about the foods you eat and the vitamins that you take. What side effects may I notice   from receiving this medicine? Side effects that you should report to your doctor or health care professional as soon as possible: -allergic reactions like skin rash, itching or hives, swelling of the face, lips, or tongue -breathing problems -changes in vision -chest pain -confusion, trouble speaking or understanding -feeling faint or lightheaded,  falls -high blood pressure -muscle aches or pains -pain, swelling, warmth in the leg -rapid weight gain -severe headaches -sudden numbness or weakness of the face, arm or leg -trouble walking, dizziness, loss of balance or coordination -seizures (convulsions) -swelling of the ankles, feet, hands -unusually weak or tired Side effects that usually do not require medical attention (report to your doctor or health care professional if they continue or are bothersome): -diarrhea -fever, chills (flu-like symptoms) -headaches -nausea, vomiting -redness, stinging, or swelling at site where injected This list may not describe all possible side effects. Call your doctor for medical advice about side effects. You may report side effects to FDA at 1-800-FDA-1088. Where should I keep my medicine? Keep out of the reach of children. Store in a refrigerator between 2 and 8 degrees C (36 and 46 degrees F). Do not freeze or shake. Throw away any unused portion if using a single-dose vial. Multi-dose vials can be kept in the refrigerator for up to 21 days after the initial dose. Throw away unused medicine. NOTE: This sheet is a summary. It may not cover all possible information. If you have questions about this medicine, talk to your doctor, pharmacist, or health care provider.    2016, [employer]vier/Gold Standard. (2008-02-23 10:25:44)  

## 2015-08-14 ENCOUNTER — Encounter (HOSPITAL_COMMUNITY)
Admission: RE | Admit: 2015-08-14 | Discharge: 2015-08-14 | Disposition: A | Discharge status: Home / Self Care | Payer: Medicare Other | Source: Ambulatory Visit | Attending: Nephrology | Admitting: Nephrology

## 2015-08-14 DIAGNOSIS — N183 Chronic kidney disease, stage 3 (moderate): Secondary | ICD-10-CM | POA: Insufficient documentation

## 2015-08-14 DIAGNOSIS — D638 Anemia in other chronic diseases classified elsewhere: Secondary | ICD-10-CM | POA: Diagnosis not present

## 2015-08-14 LAB — POCT HEMOGLOBIN-HEMACUE: Hemoglobin: 9.1 g/dL — ABNORMAL LOW (ref 12.0–15.0)

## 2015-08-14 MED ORDER — EPOETIN ALFA 10000 UNIT/ML IJ SOLN
INTRAMUSCULAR | Status: AC
Start: 2015-08-14 — End: 2015-08-14
  Filled 2015-08-14: qty 1

## 2015-08-14 MED ORDER — CLONIDINE HCL 0.1 MG PO TABS: 0.1000 mg | ORAL_TABLET | Freq: Once | ORAL | Status: DC | PRN

## 2015-08-14 MED ORDER — EPOETIN ALFA 10000 UNIT/ML IJ SOLN
5000.0000 [IU] | INTRAMUSCULAR | Status: DC
  Administered 2015-08-14: 5000 [IU] via SUBCUTANEOUS

## 2015-08-22 ENCOUNTER — Encounter (HOSPITAL_COMMUNITY)
Admission: RE | Admit: 2015-08-22 | Discharge: 2015-08-22 | Disposition: A | Discharge status: Home / Self Care | Payer: Medicare Other | Source: Ambulatory Visit | Attending: Nephrology | Admitting: Nephrology

## 2015-08-22 DIAGNOSIS — N183 Chronic kidney disease, stage 3 (moderate): Secondary | ICD-10-CM | POA: Diagnosis not present

## 2015-08-22 DIAGNOSIS — D638 Anemia in other chronic diseases classified elsewhere: Secondary | ICD-10-CM | POA: Diagnosis not present

## 2015-08-22 LAB — POCT HEMOGLOBIN-HEMACUE: Hemoglobin: 9.3 g/dL — ABNORMAL LOW (ref 12.0–15.0)

## 2015-08-22 MED ORDER — EPOETIN ALFA 10000 UNIT/ML IJ SOLN
5000.0000 [IU] | INTRAMUSCULAR | Status: DC
  Administered 2015-08-22: 5000 [IU] via SUBCUTANEOUS

## 2015-08-22 MED ORDER — EPOETIN ALFA 10000 UNIT/ML IJ SOLN
INTRAMUSCULAR | Status: AC
  Filled 2015-08-22: qty 1

## 2015-08-22 MED ORDER — CLONIDINE HCL 0.1 MG PO TABS: 0.1000 mg | ORAL_TABLET | Freq: Once | ORAL | Status: DC | PRN

## 2015-08-29 ENCOUNTER — Ambulatory Visit (HOSPITAL_COMMUNITY)
Admission: RE | Admit: 2015-08-29 | Discharge: 2015-08-29 | Disposition: A | Discharge status: Home / Self Care | Payer: Medicare Other | Source: Ambulatory Visit | Attending: Nephrology | Admitting: Nephrology

## 2015-08-29 DIAGNOSIS — N183 Chronic kidney disease, stage 3 (moderate): Secondary | ICD-10-CM | POA: Diagnosis not present

## 2015-08-29 DIAGNOSIS — Z79899 Other long term (current) drug therapy: Secondary | ICD-10-CM | POA: Insufficient documentation

## 2015-08-29 DIAGNOSIS — D631 Anemia in chronic kidney disease: Secondary | ICD-10-CM | POA: Insufficient documentation

## 2015-08-29 DIAGNOSIS — Z5181 Encounter for therapeutic drug level monitoring: Secondary | ICD-10-CM | POA: Diagnosis not present

## 2015-08-29 LAB — POCT HEMOGLOBIN-HEMACUE: Hemoglobin: 9.2 g/dL — ABNORMAL LOW (ref 12.0–15.0)

## 2015-08-29 MED ORDER — EPOETIN ALFA 10000 UNIT/ML IJ SOLN
INTRAMUSCULAR | Status: AC
  Administered 2015-08-29: 5000 [IU] via SUBCUTANEOUS
  Filled 2015-08-29: qty 1

## 2015-08-29 MED ORDER — CLONIDINE HCL 0.1 MG PO TABS: 0.1000 mg | ORAL_TABLET | Freq: Once | ORAL | Status: DC | PRN

## 2015-08-29 MED ORDER — EPOETIN ALFA 10000 UNIT/ML IJ SOLN
5000.0000 [IU] | INTRAMUSCULAR | Status: DC
  Administered 2015-08-29: 5000 [IU] via SUBCUTANEOUS

## 2015-08-31 DIAGNOSIS — N390 Urinary tract infection, site not specified: Secondary | ICD-10-CM | POA: Diagnosis not present

## 2015-09-04 ENCOUNTER — Encounter (HOSPITAL_COMMUNITY)
Admission: RE | Admit: 2015-09-04 | Discharge: 2015-09-04 | Disposition: A | Discharge status: Home / Self Care | Payer: Medicare Other | Source: Ambulatory Visit | Attending: Nephrology | Admitting: Nephrology

## 2015-09-04 DIAGNOSIS — D638 Anemia in other chronic diseases classified elsewhere: Secondary | ICD-10-CM | POA: Diagnosis not present

## 2015-09-04 DIAGNOSIS — N183 Chronic kidney disease, stage 3 (moderate): Secondary | ICD-10-CM | POA: Diagnosis not present

## 2015-09-04 LAB — POCT HEMOGLOBIN-HEMACUE: HEMOGLOBIN: 9.8 g/dL — AB (ref 12.0–15.0)

## 2015-09-04 MED ORDER — EPOETIN ALFA 10000 UNIT/ML IJ SOLN
INTRAMUSCULAR | Status: AC
  Administered 2015-09-04: 10000 [IU] via SUBCUTANEOUS
  Filled 2015-09-04: qty 1

## 2015-09-04 MED ORDER — EPOETIN ALFA 10000 UNIT/ML IJ SOLN: 5000.0000 [IU] | INTRAMUSCULAR | Status: DC

## 2015-09-12 ENCOUNTER — Encounter (HOSPITAL_COMMUNITY)
Admission: RE | Admit: 2015-09-12 | Discharge: 2015-09-12 | Disposition: A | Discharge status: Home / Self Care | Payer: Medicare Other | Source: Ambulatory Visit | Attending: Nephrology | Admitting: Nephrology

## 2015-09-12 DIAGNOSIS — N183 Chronic kidney disease, stage 3 (moderate): Secondary | ICD-10-CM | POA: Diagnosis not present

## 2015-09-12 DIAGNOSIS — D638 Anemia in other chronic diseases classified elsewhere: Secondary | ICD-10-CM | POA: Diagnosis not present

## 2015-09-12 LAB — IRON AND TIBC
Iron: 49 ug/dL (ref 28–170)
SATURATION RATIOS: 12 % (ref 10.4–31.8)
TIBC: 399 ug/dL (ref 250–450)
UIBC: 350 ug/dL

## 2015-09-12 LAB — POCT HEMOGLOBIN-HEMACUE: HEMOGLOBIN: 10.1 g/dL — AB (ref 12.0–15.0)

## 2015-09-12 LAB — FERRITIN: FERRITIN: 18 ng/mL (ref 11–307)

## 2015-09-12 MED ORDER — EPOETIN ALFA 10000 UNIT/ML IJ SOLN
5000.0000 [IU] | INTRAMUSCULAR | Status: DC
  Administered 2015-09-12: 5000 [IU] via SUBCUTANEOUS

## 2015-09-12 MED ORDER — EPOETIN ALFA 10000 UNIT/ML IJ SOLN
INTRAMUSCULAR | Status: AC
  Administered 2015-09-12: 5000 [IU] via SUBCUTANEOUS
  Filled 2015-09-12: qty 1

## 2015-09-19 ENCOUNTER — Encounter (HOSPITAL_COMMUNITY)
Admission: RE | Admit: 2015-09-19 | Discharge: 2015-09-19 | Disposition: A | Discharge status: Home / Self Care | Payer: Medicare Other | Source: Ambulatory Visit | Attending: Nephrology | Admitting: Nephrology

## 2015-09-19 DIAGNOSIS — N183 Chronic kidney disease, stage 3 (moderate): Secondary | ICD-10-CM | POA: Diagnosis not present

## 2015-09-19 DIAGNOSIS — D638 Anemia in other chronic diseases classified elsewhere: Secondary | ICD-10-CM | POA: Diagnosis not present

## 2015-09-19 LAB — POCT HEMOGLOBIN-HEMACUE: Hemoglobin: 9.6 g/dL — ABNORMAL LOW (ref 12.0–15.0)

## 2015-09-19 MED ORDER — EPOETIN ALFA 10000 UNIT/ML IJ SOLN
5000.0000 [IU] | INTRAMUSCULAR | Status: DC
  Administered 2015-09-19: 5000 [IU] via SUBCUTANEOUS

## 2015-09-19 MED ORDER — EPOETIN ALFA 10000 UNIT/ML IJ SOLN
INTRAMUSCULAR | Status: AC
  Filled 2015-09-19: qty 1

## 2015-09-27 ENCOUNTER — Encounter (HOSPITAL_COMMUNITY)
Admission: RE | Admit: 2015-09-27 | Discharge: 2015-09-27 | Disposition: A | Discharge status: Home / Self Care | Payer: Medicare Other | Source: Ambulatory Visit | Attending: Nephrology | Admitting: Nephrology

## 2015-09-27 DIAGNOSIS — D638 Anemia in other chronic diseases classified elsewhere: Secondary | ICD-10-CM | POA: Diagnosis not present

## 2015-09-27 DIAGNOSIS — N183 Chronic kidney disease, stage 3 (moderate): Secondary | ICD-10-CM | POA: Diagnosis not present

## 2015-09-27 LAB — POCT HEMOGLOBIN-HEMACUE: Hemoglobin: 10.5 g/dL — ABNORMAL LOW (ref 12.0–15.0)

## 2015-09-27 MED ORDER — EPOETIN ALFA 10000 UNIT/ML IJ SOLN
5000.0000 [IU] | INTRAMUSCULAR | Status: DC
  Administered 2015-09-27: 5000 [IU] via SUBCUTANEOUS

## 2015-09-27 MED ORDER — EPOETIN ALFA 10000 UNIT/ML IJ SOLN
INTRAMUSCULAR | Status: AC
  Filled 2015-09-27: qty 1

## 2015-10-03 ENCOUNTER — Encounter (HOSPITAL_COMMUNITY)
Admission: RE | Admit: 2015-10-03 | Discharge: 2015-10-03 | Disposition: A | Discharge status: Home / Self Care | Payer: Medicare Other | Source: Ambulatory Visit | Attending: Nephrology | Admitting: Nephrology

## 2015-10-03 DIAGNOSIS — N183 Chronic kidney disease, stage 3 (moderate): Secondary | ICD-10-CM | POA: Diagnosis not present

## 2015-10-03 DIAGNOSIS — D638 Anemia in other chronic diseases classified elsewhere: Secondary | ICD-10-CM | POA: Diagnosis not present

## 2015-10-03 LAB — POCT HEMOGLOBIN-HEMACUE: HEMOGLOBIN: 10.4 g/dL — AB (ref 12.0–15.0)

## 2015-10-03 MED ORDER — EPOETIN ALFA 10000 UNIT/ML IJ SOLN
5000.0000 [IU] | INTRAMUSCULAR | Status: DC
  Administered 2015-10-03: 5000 [IU] via SUBCUTANEOUS

## 2015-10-03 MED ORDER — EPOETIN ALFA 10000 UNIT/ML IJ SOLN
INTRAMUSCULAR | Status: AC
  Filled 2015-10-03: qty 1

## 2015-10-10 ENCOUNTER — Encounter (HOSPITAL_COMMUNITY): Payer: Medicare Other

## 2015-10-11 ENCOUNTER — Encounter (HOSPITAL_COMMUNITY)
Admission: RE | Admit: 2015-10-11 | Discharge: 2015-10-11 | Disposition: A | Discharge status: Home / Self Care | Payer: Medicare Other | Source: Ambulatory Visit | Attending: Nephrology | Admitting: Nephrology

## 2015-10-11 DIAGNOSIS — D638 Anemia in other chronic diseases classified elsewhere: Secondary | ICD-10-CM | POA: Diagnosis not present

## 2015-10-11 DIAGNOSIS — N183 Chronic kidney disease, stage 3 (moderate): Secondary | ICD-10-CM | POA: Diagnosis not present

## 2015-10-11 LAB — POCT HEMOGLOBIN-HEMACUE: HEMOGLOBIN: 9.8 g/dL — AB (ref 12.0–15.0)

## 2015-10-11 LAB — IRON AND TIBC
Iron: 52 ug/dL (ref 28–170)
SATURATION RATIOS: 14 % (ref 10.4–31.8)
TIBC: 385 ug/dL (ref 250–450)
UIBC: 333 ug/dL

## 2015-10-11 LAB — FERRITIN: Ferritin: 10 ng/mL — ABNORMAL LOW (ref 11–307)

## 2015-10-11 MED ORDER — EPOETIN ALFA 10000 UNIT/ML IJ SOLN
INTRAMUSCULAR | Status: AC
  Administered 2015-10-11: 5000 [IU] via SUBCUTANEOUS
  Filled 2015-10-11: qty 1

## 2015-10-11 MED ORDER — EPOETIN ALFA 10000 UNIT/ML IJ SOLN
5000.0000 [IU] | INTRAMUSCULAR | Status: DC
  Administered 2015-10-11: 5000 [IU] via SUBCUTANEOUS

## 2015-10-18 ENCOUNTER — Encounter (HOSPITAL_COMMUNITY): Payer: Medicare Other

## 2015-10-19 ENCOUNTER — Other Ambulatory Visit: Payer: Self-pay | Admitting: Nephrology

## 2015-10-19 DIAGNOSIS — D638 Anemia in other chronic diseases classified elsewhere: Secondary | ICD-10-CM | POA: Insufficient documentation

## 2015-10-20 ENCOUNTER — Inpatient Hospital Stay (HOSPITAL_COMMUNITY): Admission: RE | Admit: 2015-10-20 | Payer: Medicare Other | Source: Ambulatory Visit

## 2015-10-27 ENCOUNTER — Ambulatory Visit (HOSPITAL_COMMUNITY)
Admission: RE | Admit: 2015-10-27 | Discharge: 2015-10-27 | Disposition: A | Discharge status: Home / Self Care | Payer: Medicare Other | Source: Ambulatory Visit | Attending: Nephrology | Admitting: Nephrology

## 2015-10-27 DIAGNOSIS — N183 Chronic kidney disease, stage 3 (moderate): Secondary | ICD-10-CM | POA: Diagnosis not present

## 2015-10-27 DIAGNOSIS — D638 Anemia in other chronic diseases classified elsewhere: Secondary | ICD-10-CM

## 2015-10-27 DIAGNOSIS — D631 Anemia in chronic kidney disease: Secondary | ICD-10-CM | POA: Diagnosis not present

## 2015-10-27 DIAGNOSIS — Z79899 Other long term (current) drug therapy: Secondary | ICD-10-CM | POA: Diagnosis not present

## 2015-10-27 DIAGNOSIS — Z5181 Encounter for therapeutic drug level monitoring: Secondary | ICD-10-CM | POA: Insufficient documentation

## 2015-10-27 LAB — POCT HEMOGLOBIN-HEMACUE: HEMOGLOBIN: 10.2 g/dL — AB (ref 12.0–15.0)

## 2015-10-27 MED ORDER — EPOETIN ALFA 10000 UNIT/ML IJ SOLN
INTRAMUSCULAR | Status: AC
  Administered 2015-10-27: 5000 [IU]
  Filled 2015-10-27: qty 1

## 2015-10-27 MED ORDER — EPOETIN ALFA 10000 UNIT/ML IJ SOLN: 5000.0000 [IU] | INTRAMUSCULAR | Status: DC

## 2015-10-31 ENCOUNTER — Inpatient Hospital Stay (HOSPITAL_COMMUNITY): Admission: RE | Admit: 2015-10-31 | Payer: Medicare Other | Source: Ambulatory Visit

## 2015-11-06 ENCOUNTER — Other Ambulatory Visit (HOSPITAL_COMMUNITY): Payer: Self-pay | Admitting: *Deleted

## 2015-11-07 ENCOUNTER — Ambulatory Visit (HOSPITAL_COMMUNITY)
Admission: RE | Admit: 2015-11-07 | Discharge: 2015-11-07 | Disposition: A | Discharge status: Home / Self Care | Payer: Medicare Other | Source: Ambulatory Visit | Attending: Nephrology | Admitting: Nephrology

## 2015-11-07 DIAGNOSIS — D509 Iron deficiency anemia, unspecified: Secondary | ICD-10-CM | POA: Diagnosis not present

## 2015-11-07 DIAGNOSIS — D638 Anemia in other chronic diseases classified elsewhere: Secondary | ICD-10-CM | POA: Diagnosis not present

## 2015-11-07 DIAGNOSIS — N183 Chronic kidney disease, stage 3 (moderate): Secondary | ICD-10-CM | POA: Insufficient documentation

## 2015-11-07 LAB — POCT HEMOGLOBIN-HEMACUE: HEMOGLOBIN: 10.2 g/dL — AB (ref 12.0–15.0)

## 2015-11-07 LAB — IRON AND TIBC
Iron: 57 ug/dL (ref 28–170)
Saturation Ratios: 14 % (ref 10.4–31.8)
TIBC: 398 ug/dL (ref 250–450)
UIBC: 341 ug/dL

## 2015-11-07 LAB — FERRITIN: Ferritin: 11 ng/mL (ref 11–307)

## 2015-11-07 MED ORDER — EPOETIN ALFA 10000 UNIT/ML IJ SOLN
5000.0000 [IU] | INTRAMUSCULAR | Status: DC
  Administered 2015-11-07: 5000 [IU] via SUBCUTANEOUS

## 2015-11-07 MED ORDER — SODIUM CHLORIDE 0.9 % IV SOLN
510.0000 mg | INTRAVENOUS | Status: DC
  Administered 2015-11-07: 510 mg via INTRAVENOUS
  Filled 2015-11-07: qty 17

## 2015-11-07 MED ORDER — EPOETIN ALFA 10000 UNIT/ML IJ SOLN
INTRAMUSCULAR | Status: AC
  Filled 2015-11-07: qty 1

## 2015-11-14 ENCOUNTER — Encounter (HOSPITAL_COMMUNITY)
Admission: RE | Admit: 2015-11-14 | Discharge: 2015-11-14 | Disposition: A | Discharge status: Home / Self Care | Payer: Medicare Other | Source: Ambulatory Visit | Attending: Nephrology | Admitting: Nephrology

## 2015-11-14 DIAGNOSIS — N183 Chronic kidney disease, stage 3 (moderate): Secondary | ICD-10-CM | POA: Insufficient documentation

## 2015-11-14 DIAGNOSIS — Z5181 Encounter for therapeutic drug level monitoring: Secondary | ICD-10-CM | POA: Diagnosis not present

## 2015-11-14 DIAGNOSIS — D638 Anemia in other chronic diseases classified elsewhere: Secondary | ICD-10-CM

## 2015-11-14 DIAGNOSIS — D509 Iron deficiency anemia, unspecified: Secondary | ICD-10-CM | POA: Diagnosis not present

## 2015-11-14 DIAGNOSIS — Z79899 Other long term (current) drug therapy: Secondary | ICD-10-CM | POA: Insufficient documentation

## 2015-11-14 DIAGNOSIS — D631 Anemia in chronic kidney disease: Secondary | ICD-10-CM | POA: Insufficient documentation

## 2015-11-14 LAB — POCT HEMOGLOBIN-HEMACUE: HEMOGLOBIN: 11.2 g/dL — AB (ref 12.0–15.0)

## 2015-11-14 MED ORDER — SODIUM CHLORIDE 0.9 % IV SOLN
510.0000 mg | INTRAVENOUS | Status: AC
  Administered 2015-11-14: 510 mg via INTRAVENOUS
  Filled 2015-11-14: qty 17

## 2015-11-14 MED ORDER — EPOETIN ALFA 10000 UNIT/ML IJ SOLN
5000.0000 [IU] | INTRAMUSCULAR | Status: DC
Start: 2015-11-14 — End: 2015-11-15
  Administered 2015-11-14: 5000 [IU] via SUBCUTANEOUS

## 2015-11-14 MED ORDER — EPOETIN ALFA 10000 UNIT/ML IJ SOLN
INTRAMUSCULAR | Status: AC
  Filled 2015-11-14: qty 1

## 2015-11-20 ENCOUNTER — Other Ambulatory Visit (HOSPITAL_COMMUNITY): Payer: Self-pay | Admitting: *Deleted

## 2015-11-21 ENCOUNTER — Encounter (HOSPITAL_COMMUNITY)
Admission: RE | Admit: 2015-11-21 | Discharge: 2015-11-21 | Disposition: A | Discharge status: Home / Self Care | Payer: Medicare Other | Source: Ambulatory Visit | Attending: Nephrology | Admitting: Nephrology

## 2015-11-21 DIAGNOSIS — N183 Chronic kidney disease, stage 3 (moderate): Secondary | ICD-10-CM | POA: Diagnosis not present

## 2015-11-21 DIAGNOSIS — D638 Anemia in other chronic diseases classified elsewhere: Secondary | ICD-10-CM

## 2015-11-21 LAB — POCT HEMOGLOBIN-HEMACUE: Hemoglobin: 12.3 g/dL (ref 12.0–15.0)

## 2015-11-21 MED ORDER — EPOETIN ALFA 10000 UNIT/ML IJ SOLN: 5000.0000 [IU] | INTRAMUSCULAR | Status: DC

## 2015-12-05 ENCOUNTER — Encounter (HOSPITAL_COMMUNITY): Payer: Medicare Other

## 2015-12-25 ENCOUNTER — Encounter (HOSPITAL_COMMUNITY): Payer: Medicare Other

## 2016-02-06 ENCOUNTER — Other Ambulatory Visit (HOSPITAL_COMMUNITY): Payer: Self-pay | Admitting: *Deleted

## 2016-02-07 ENCOUNTER — Inpatient Hospital Stay (HOSPITAL_COMMUNITY): Admission: RE | Admit: 2016-02-07 | Payer: Medicare Other | Source: Ambulatory Visit

## 2016-02-23 DIAGNOSIS — J189 Pneumonia, unspecified organism: Secondary | ICD-10-CM

## 2016-02-23 HISTORY — DX: Pneumonia, unspecified organism: J18.9

## 2016-02-27 DIAGNOSIS — R531 Weakness: Secondary | ICD-10-CM | POA: Diagnosis not present

## 2016-02-27 DIAGNOSIS — D649 Anemia, unspecified: Secondary | ICD-10-CM | POA: Diagnosis not present

## 2016-02-29 ENCOUNTER — Other Ambulatory Visit: Payer: Self-pay | Admitting: Family Medicine

## 2016-02-29 DIAGNOSIS — R419 Unspecified symptoms and signs involving cognitive functions and awareness: Secondary | ICD-10-CM

## 2016-02-29 DIAGNOSIS — R630 Anorexia: Secondary | ICD-10-CM

## 2016-02-29 DIAGNOSIS — R531 Weakness: Secondary | ICD-10-CM

## 2016-03-02 ENCOUNTER — Emergency Department (HOSPITAL_COMMUNITY): Payer: Medicare Other

## 2016-03-02 ENCOUNTER — Inpatient Hospital Stay (HOSPITAL_COMMUNITY)
Admission: EM | Admit: 2016-03-02 | Discharge: 2016-03-06 | DRG: 193 | Disposition: A | Discharge status: Skilled Nursing Facility | Payer: Medicare Other | Attending: Internal Medicine | Admitting: Internal Medicine

## 2016-03-02 ENCOUNTER — Encounter (HOSPITAL_COMMUNITY): Payer: Self-pay | Admitting: Emergency Medicine

## 2016-03-02 ENCOUNTER — Observation Stay (HOSPITAL_COMMUNITY): Payer: Medicare Other

## 2016-03-02 DIAGNOSIS — J181 Lobar pneumonia, unspecified organism: Secondary | ICD-10-CM | POA: Diagnosis not present

## 2016-03-02 DIAGNOSIS — N189 Chronic kidney disease, unspecified: Secondary | ICD-10-CM | POA: Diagnosis present

## 2016-03-02 DIAGNOSIS — E43 Unspecified severe protein-calorie malnutrition: Secondary | ICD-10-CM | POA: Insufficient documentation

## 2016-03-02 DIAGNOSIS — R410 Disorientation, unspecified: Secondary | ICD-10-CM

## 2016-03-02 DIAGNOSIS — E860 Dehydration: Secondary | ICD-10-CM | POA: Diagnosis not present

## 2016-03-02 DIAGNOSIS — G9341 Metabolic encephalopathy: Secondary | ICD-10-CM | POA: Diagnosis present

## 2016-03-02 DIAGNOSIS — N183 Chronic kidney disease, stage 3 (moderate): Secondary | ICD-10-CM | POA: Diagnosis present

## 2016-03-02 DIAGNOSIS — R2689 Other abnormalities of gait and mobility: Secondary | ICD-10-CM

## 2016-03-02 DIAGNOSIS — R740 Nonspecific elevation of levels of transaminase and lactic acid dehydrogenase [LDH]: Secondary | ICD-10-CM | POA: Diagnosis present

## 2016-03-02 DIAGNOSIS — R4701 Aphasia: Secondary | ICD-10-CM | POA: Diagnosis not present

## 2016-03-02 DIAGNOSIS — F0390 Unspecified dementia without behavioral disturbance: Secondary | ICD-10-CM | POA: Diagnosis not present

## 2016-03-02 DIAGNOSIS — R627 Adult failure to thrive: Secondary | ICD-10-CM | POA: Diagnosis present

## 2016-03-02 DIAGNOSIS — J189 Pneumonia, unspecified organism: Principal | ICD-10-CM | POA: Diagnosis present

## 2016-03-02 DIAGNOSIS — R404 Transient alteration of awareness: Secondary | ICD-10-CM | POA: Diagnosis not present

## 2016-03-02 DIAGNOSIS — I129 Hypertensive chronic kidney disease with stage 1 through stage 4 chronic kidney disease, or unspecified chronic kidney disease: Secondary | ICD-10-CM | POA: Diagnosis present

## 2016-03-02 DIAGNOSIS — R531 Weakness: Secondary | ICD-10-CM

## 2016-03-02 DIAGNOSIS — R7989 Other specified abnormal findings of blood chemistry: Secondary | ICD-10-CM | POA: Diagnosis present

## 2016-03-02 DIAGNOSIS — I248 Other forms of acute ischemic heart disease: Secondary | ICD-10-CM | POA: Diagnosis present

## 2016-03-02 DIAGNOSIS — G9340 Encephalopathy, unspecified: Secondary | ICD-10-CM | POA: Diagnosis present

## 2016-03-02 DIAGNOSIS — E7800 Pure hypercholesterolemia, unspecified: Secondary | ICD-10-CM | POA: Diagnosis present

## 2016-03-02 DIAGNOSIS — R778 Other specified abnormalities of plasma proteins: Secondary | ICD-10-CM | POA: Diagnosis present

## 2016-03-02 DIAGNOSIS — Z681 Body mass index (BMI) 19 or less, adult: Secondary | ICD-10-CM

## 2016-03-02 DIAGNOSIS — K769 Liver disease, unspecified: Secondary | ICD-10-CM | POA: Insufficient documentation

## 2016-03-02 DIAGNOSIS — R7401 Elevation of levels of liver transaminase levels: Secondary | ICD-10-CM | POA: Diagnosis present

## 2016-03-02 DIAGNOSIS — R05 Cough: Secondary | ICD-10-CM | POA: Diagnosis not present

## 2016-03-02 DIAGNOSIS — I10 Essential (primary) hypertension: Secondary | ICD-10-CM | POA: Diagnosis present

## 2016-03-02 HISTORY — DX: Pneumonia, unspecified organism: J18.9

## 2016-03-02 HISTORY — DX: Chronic kidney disease, unspecified: N18.9

## 2016-03-02 HISTORY — DX: Liver disease, unspecified: K76.9

## 2016-03-02 HISTORY — DX: Unspecified protein-calorie malnutrition: E46

## 2016-03-02 LAB — URINALYSIS, ROUTINE W REFLEX MICROSCOPIC
BILIRUBIN URINE: NEGATIVE
Bacteria, UA: NONE SEEN
Glucose, UA: NEGATIVE mg/dL
Ketones, ur: NEGATIVE mg/dL
LEUKOCYTES UA: NEGATIVE
Nitrite: NEGATIVE
PH: 5 (ref 5.0–8.0)
Protein, ur: 30 mg/dL — AB
SPECIFIC GRAVITY, URINE: 1.02 (ref 1.005–1.030)

## 2016-03-02 LAB — AMMONIA: Ammonia: 15 umol/L (ref 9–35)

## 2016-03-02 LAB — COMPREHENSIVE METABOLIC PANEL
ALBUMIN: 2.7 g/dL — AB (ref 3.5–5.0)
ALT: 196 U/L — ABNORMAL HIGH (ref 14–54)
ANION GAP: 13 (ref 5–15)
AST: 206 U/L — ABNORMAL HIGH (ref 15–41)
Alkaline Phosphatase: 77 U/L (ref 38–126)
BUN: 64 mg/dL — ABNORMAL HIGH (ref 6–20)
CALCIUM: 9.9 mg/dL (ref 8.9–10.3)
CHLORIDE: 109 mmol/L (ref 101–111)
CO2: 17 mmol/L — AB (ref 22–32)
Creatinine, Ser: 1.58 mg/dL — ABNORMAL HIGH (ref 0.44–1.00)
GFR calc non Af Amer: 29 mL/min — ABNORMAL LOW (ref >60)
GFR, EST AFRICAN AMERICAN: 33 mL/min — AB (ref >60)
GLUCOSE: 118 mg/dL — AB (ref 65–99)
POTASSIUM: 4.3 mmol/L (ref 3.5–5.1)
SODIUM: 139 mmol/L (ref 135–145)
Total Bilirubin: 1.3 mg/dL — ABNORMAL HIGH (ref 0.3–1.2)
Total Protein: 6.7 g/dL (ref 6.5–8.1)

## 2016-03-02 LAB — TROPONIN I
TROPONIN I: 0.21 ng/mL — AB (ref <0.03)
Troponin I: 0.22 ng/mL (ref <0.03)

## 2016-03-02 LAB — CBC
HEMATOCRIT: 37 % (ref 36.0–46.0)
HEMOGLOBIN: 12.8 g/dL (ref 12.0–15.0)
MCH: 31.5 pg (ref 26.0–34.0)
MCHC: 34.6 g/dL (ref 30.0–36.0)
MCV: 91.1 fL (ref 78.0–100.0)
Platelets: 273 10*3/uL (ref 150–400)
RBC: 4.06 MIL/uL (ref 3.87–5.11)
RDW: 14.9 % (ref 11.5–15.5)
WBC: 10.6 10*3/uL — ABNORMAL HIGH (ref 4.0–10.5)

## 2016-03-02 LAB — MAGNESIUM: Magnesium: 2.5 mg/dL — ABNORMAL HIGH (ref 1.7–2.4)

## 2016-03-02 LAB — PHOSPHORUS: Phosphorus: 3.6 mg/dL (ref 2.5–4.6)

## 2016-03-02 LAB — CREATININE, SERUM
Creatinine, Ser: 1.52 mg/dL — ABNORMAL HIGH (ref 0.44–1.00)
GFR, EST AFRICAN AMERICAN: 35 mL/min — AB (ref >60)
GFR, EST NON AFRICAN AMERICAN: 30 mL/min — AB (ref >60)

## 2016-03-02 LAB — PROTIME-INR
INR: 1.2
Prothrombin Time: 15.2 seconds (ref 11.4–15.2)

## 2016-03-02 LAB — INFLUENZA PANEL BY PCR (TYPE A & B)
INFLAPCR: NEGATIVE
Influenza B By PCR: NEGATIVE

## 2016-03-02 LAB — VITAMIN B12: VITAMIN B 12: 766 pg/mL (ref 180–914)

## 2016-03-02 LAB — CBG MONITORING, ED: GLUCOSE-CAPILLARY: 116 mg/dL — AB (ref 65–99)

## 2016-03-02 LAB — STREP PNEUMONIAE URINARY ANTIGEN: Strep Pneumo Urinary Antigen: NEGATIVE

## 2016-03-02 LAB — LACTIC ACID, PLASMA
LACTIC ACID, VENOUS: 1.5 mmol/L (ref 0.5–1.9)
Lactic Acid, Venous: 1.1 mmol/L (ref 0.5–1.9)

## 2016-03-02 MED ORDER — TRAZODONE HCL 50 MG PO TABS: 25.0000 mg | ORAL_TABLET | Freq: Every evening | ORAL | Status: DC | PRN

## 2016-03-02 MED ORDER — SENNOSIDES-DOCUSATE SODIUM 8.6-50 MG PO TABS
1.0000 | ORAL_TABLET | Freq: Every evening | ORAL | Status: DC | PRN
  Administered 2016-03-05: 1 via ORAL
  Filled 2016-03-02: qty 1

## 2016-03-02 MED ORDER — IPRATROPIUM-ALBUTEROL 0.5-2.5 (3) MG/3ML IN SOLN
3.0000 mL | Freq: Two times a day (BID) | RESPIRATORY_TRACT | Status: DC
  Administered 2016-03-03 – 2016-03-06 (×7): 3 mL via RESPIRATORY_TRACT
  Filled 2016-03-02 (×8): qty 3

## 2016-03-02 MED ORDER — DEXTROSE 5 % IV SOLN
1.0000 g | INTRAVENOUS | Status: DC
  Administered 2016-03-03 – 2016-03-06 (×4): 1 g via INTRAVENOUS
  Filled 2016-03-02 (×4): qty 10

## 2016-03-02 MED ORDER — HYDROCODONE-ACETAMINOPHEN 5-325 MG PO TABS: 1.0000 | ORAL_TABLET | ORAL | Status: DC | PRN

## 2016-03-02 MED ORDER — SODIUM CHLORIDE 0.9 % IV SOLN
INTRAVENOUS | Status: AC
  Administered 2016-03-02: 13:00:00 via INTRAVENOUS

## 2016-03-02 MED ORDER — DEXTROSE 5 % IV SOLN: 1.0000 g | INTRAVENOUS | Status: DC

## 2016-03-02 MED ORDER — IPRATROPIUM-ALBUTEROL 0.5-2.5 (3) MG/3ML IN SOLN
3.0000 mL | Freq: Four times a day (QID) | RESPIRATORY_TRACT | Status: DC
  Administered 2016-03-02: 3 mL via RESPIRATORY_TRACT
  Filled 2016-03-02: qty 3

## 2016-03-02 MED ORDER — AZITHROMYCIN 500 MG PO TABS: 500.0000 mg | ORAL_TABLET | ORAL | Status: DC

## 2016-03-02 MED ORDER — DEXTROSE 5 % IV SOLN
500.0000 mg | Freq: Once | INTRAVENOUS | Status: AC
  Administered 2016-03-02: 500 mg via INTRAVENOUS
  Filled 2016-03-02: qty 500

## 2016-03-02 MED ORDER — ALBUTEROL SULFATE (2.5 MG/3ML) 0.083% IN NEBU: 2.5000 mg | INHALATION_SOLUTION | RESPIRATORY_TRACT | Status: DC | PRN

## 2016-03-02 MED ORDER — AZITHROMYCIN 500 MG PO TABS
500.0000 mg | ORAL_TABLET | ORAL | Status: DC
  Administered 2016-03-03 – 2016-03-05 (×3): 500 mg via ORAL
  Filled 2016-03-02 (×5): qty 1

## 2016-03-02 MED ORDER — SODIUM CHLORIDE 0.9 % IV BOLUS (SEPSIS)
500.0000 mL | Freq: Once | INTRAVENOUS | Status: AC
  Administered 2016-03-02: 500 mL via INTRAVENOUS

## 2016-03-02 MED ORDER — ATORVASTATIN CALCIUM 40 MG PO TABS: 40.0000 mg | ORAL_TABLET | Freq: Every day | ORAL | Status: DC

## 2016-03-02 MED ORDER — ONDANSETRON HCL 4 MG/2ML IJ SOLN: 4.0000 mg | Freq: Four times a day (QID) | INTRAMUSCULAR | Status: DC | PRN

## 2016-03-02 MED ORDER — ALBUTEROL SULFATE (2.5 MG/3ML) 0.083% IN NEBU: 2.5000 mg | INHALATION_SOLUTION | Freq: Four times a day (QID) | RESPIRATORY_TRACT | Status: DC | PRN

## 2016-03-02 MED ORDER — ONDANSETRON HCL 4 MG PO TABS: 4.0000 mg | ORAL_TABLET | Freq: Four times a day (QID) | ORAL | Status: DC | PRN

## 2016-03-02 MED ORDER — HEPARIN SODIUM (PORCINE) 5000 UNIT/ML IJ SOLN
5000.0000 [IU] | Freq: Three times a day (TID) | INTRAMUSCULAR | Status: DC
  Administered 2016-03-02 – 2016-03-06 (×12): 5000 [IU] via SUBCUTANEOUS
  Filled 2016-03-02 (×12): qty 1

## 2016-03-02 MED ORDER — DEXTROSE 5 % IV SOLN
1.0000 g | Freq: Once | INTRAVENOUS | Status: AC
  Administered 2016-03-02: 1 g via INTRAVENOUS
  Filled 2016-03-02: qty 10

## 2016-03-02 MED ORDER — ENSURE ENLIVE PO LIQD
237.0000 mL | Freq: Two times a day (BID) | ORAL | Status: DC
  Administered 2016-03-02 – 2016-03-06 (×8): 237 mL via ORAL

## 2016-03-02 MED ORDER — ACETAMINOPHEN 650 MG RE SUPP: 650.0000 mg | Freq: Four times a day (QID) | RECTAL | Status: DC | PRN

## 2016-03-02 MED ORDER — ACETAMINOPHEN 325 MG PO TABS: 650.0000 mg | ORAL_TABLET | Freq: Four times a day (QID) | ORAL | Status: DC | PRN

## 2016-03-02 MED ORDER — AMLODIPINE BESYLATE 2.5 MG PO TABS
2.5000 mg | ORAL_TABLET | Freq: Every day | ORAL | Status: DC
  Administered 2016-03-03 – 2016-03-06 (×4): 2.5 mg via ORAL
  Filled 2016-03-02 (×4): qty 1

## 2016-03-02 NOTE — H&P (Signed)
History and Physical    Jamie Lester [id number] DOB: Feb 17, 1929 DOA: 03/02/2016  PCP: Dr Chapman Fitch with [facility] Patient coming from: home  Chief Complaint: ams/generalized weakness/decreased oral intake  HPI: Jamie Lester is a 82 y.o. female with medical history significant dementia, hypertension, hyperlipidemia chronic kidney disease, GI bleed, liver disease presents to emergency Department chief complaint of altered mental status generalized weakness. Initial evaluation reveals a chest x-ray concerning for pneumonia sitting liver function tests elevated troponin leukocytosis.  Information is obtained from the son who is primary caregiver. Reports patient in his usual state of health until about 2 days ago she developed gradual worsening cough with decreased oral intake. Yesterday she developed confusion and lethargy. She does have baseline dementia but son reports she is usually verbally interactive and is able to make her once a needs known. She typically ambulates with a walker but has been too weak to do so over the last several days. Son also reported frequent coughing productive with thick sputum. There is been no vomiting no diarrhea. He reports she "felt warm". No complaints of dysuria hematuria frequency or urgency. No complaints of headache dizziness syncope or near-syncope. Patient is alert but nonverbal. She will follow commands.    ED Course: In the emergency department she is afebrile hemodynamically stable and not hypoxic. She is provided with gentle IV fluids Rocephin and azithromycin.  Review of Systems: As per HPI otherwise 10 point review of systems negative. As reported by her primary caregiver her son  Ambulatory Status: Usually ambulates with a walker. Has had no falls and 7 months. She is able to make her wants and needs known but needs assistance with dressing eating  Past Medical History:  Diagnosis Date  . CKD (chronic kidney disease)   . Dementia   .  Diverticulosis   . Hemorrhoid   . Hypercholesteremia   . Hypertension   . Liver disease     Past Surgical History:  Procedure Laterality Date  . CESAREAN SECTION      Social History   Social History  . Marital status: Married    Spouse name: N/A  . Number of children: N/A  . Years of education: N/A   Occupational History  . Not on file.   Social History Main Topics  . Smoking status: Never Smoker  . Smokeless tobacco: Not on file  . Alcohol use No  . Drug use: No  . Sexual activity: Not on file   Other Topics Concern  . Not on file   Social History Narrative  . No narrative on file    No Known Allergies  Family History  Problem Relation Age of Onset  . Colon cancer Neg Hx     Prior to Admission medications   Medication Sig Start Date End Date Taking? Authorizing Provider  amLODipine (NORVASC) 2.5 MG tablet Take 2.5 mg by mouth daily. 06/26/15   Historical Provider, MD  atorvastatin (LIPITOR) 40 MG tablet Take 40 mg by mouth daily. 07/13/15   Historical Provider, MD  Multiple Vitamin (MULTIVITAMIN WITH MINERALS) TABS tablet Take 1 tablet by mouth daily.    Historical Provider, MD    Physical Exam: Vitals:   03/02/16 [id number] 03/02/16 0728 03/02/16 0756 03/02/16 0810  BP:  140/85 146/88   Pulse:  88 86   Resp:  18 18   Temp: 98.2 F (36.8 C)  98.1 F (36.7 C)   TempSrc:   Oral   SpO2:  96% 96%   Weight:  44.5 kg (98 lb 3.2 oz)  Height:    5\' 2"  (1.575 m)     General:  Appears calm and comfortable, frail no acute distress Eyes:  PERRL, EOMI, normal lids, iris ENT:  grossly normal hearing, lips & tongue, because membranes of her mouth are pink slightly dry Neck:  no LAD, masses or thyromegaly Cardiovascular:  RRR, no m/r/g. No LE edema. Pedal pulses present and palpable Respiratory:  Normal respiratory effort. Respirations slightly shallow breath sounds somewhat distant fine crackles bilateral bases a hear no wheezing Abdomen:  soft, ntnd, positive  bowel sounds but sluggish no guarding or rebounding Skin:  no rash or induration seen on limited exam Musculoskeletal:  grossly normal tone BUE/BLE, good ROM, no bony abnormality Psychiatric:  grossly normal mood and affect, speech fluent and appropriate, he is alert Neurologic:  CN 2-12 grossly intact, moves all extremities in coordinated fashion, sensation intact. She is alert will follow simple commands nonverbal on admission  Labs on Admission: I have personally reviewed following labs and imaging studies  CBC:  Recent Labs Lab 03/02/16 0532  WBC 10.6*  HGB 12.8  HCT 37.0  MCV 91.1  PLT 123456   Basic Metabolic Panel:  Recent Labs Lab 03/02/16 0532  NA 139  K 4.3  CL 109  CO2 17*  GLUCOSE 118*  BUN 64*  CREATININE 1.58*  CALCIUM 9.9   GFR: Estimated Creatinine Clearance: 18.3 mL/min (by C-G formula based on SCr of 1.58 mg/dL (H)). Liver Function Tests:  Recent Labs Lab 03/02/16 0532  AST 206*  ALT 196*  ALKPHOS 77  BILITOT 1.3*  PROT 6.7  ALBUMIN 2.7*   No results for input(s): LIPASE, AMYLASE in the last 168 hours.  Recent Labs Lab 03/02/16 0532  AMMONIA 15   Coagulation Profile: No results for input(s): INR, PROTIME in the last 168 hours. Cardiac Enzymes:  Recent Labs Lab 03/02/16 0532  TROPONINI 0.22*   BNP (last 3 results) No results for input(s): PROBNP in the last 8760 hours. HbA1C: No results for input(s): HGBA1C in the last 72 hours. CBG:  Recent Labs Lab 03/02/16 0536  GLUCAP 116*   Lipid Profile: No results for input(s): CHOL, HDL, LDLCALC, TRIG, CHOLHDL, LDLDIRECT in the last 72 hours. Thyroid Function Tests: No results for input(s): TSH, T4TOTAL, FREET4, T3FREE, THYROIDAB in the last 72 hours. Anemia Panel: No results for input(s): VITAMINB12, FOLATE, FERRITIN, TIBC, IRON, RETICCTPCT in the last 72 hours. Urine analysis:    Component Value Date/Time   COLORURINE YELLOW 03/02/2016 0605   APPEARANCEUR HAZY (A) 03/02/2016  0605   LABSPEC 1.020 03/02/2016 0605   PHURINE 5.0 03/02/2016 0605   GLUCOSEU NEGATIVE 03/02/2016 0605   HGBUR LARGE (A) 03/02/2016 0605   BILIRUBINUR NEGATIVE 03/02/2016 0605   KETONESUR NEGATIVE 03/02/2016 0605   PROTEINUR 30 (A) 03/02/2016 0605   NITRITE NEGATIVE 03/02/2016 0605   LEUKOCYTESUR NEGATIVE 03/02/2016 0605    Creatinine Clearance: Estimated Creatinine Clearance: 18.3 mL/min (by C-G formula based on SCr of 1.58 mg/dL (H)).  Sepsis Labs: @LABRCNTIP (procalcitonin:4,lacticidven:4) )No results found for this or any previous visit (from the past 240 hour(s)).   Radiological Exams on Admission: Dg Chest Portable 1 View  Result Date: 03/02/2016 CLINICAL DATA:  Cough and lethargy for 2 days. EXAM: PORTABLE CHEST 1 VIEW COMPARISON:  None. FINDINGS: There is consolidation in the left base. There is moderate cardiomegaly. No large effusion. Right lung is clear. Pulmonary vasculature is normal. Hilar and mediastinal contours are unremarkable. IMPRESSION: Left base  consolidation, suspicious for pneumonia. Followup PA and lateral chest X-ray is recommended in 3-4 weeks following trial of antibiotic therapy to ensure resolution and exclude underlying malignancy. Electronically Signed   By: Andreas Newport M.D.   On: 03/02/2016 06:29    EKG: Independently reviewed. Sinus rhythm Anterior infarct, old No significant change since last tracing  Assessment/Plan Principal Problem:   Acute encephalopathy Active Problems:   Dementia   Hypercholesteremia   Essential hypertension   PNA (pneumonia)   CKD (chronic kidney disease)   Elevated troponin   Generalized weakness   Elevated transaminase level   #1. Acute encephalopathy in the setting of moderate dementia. Likely related to pneumonia and dehydration versus what appears to be worsening liver disease. Chest x-ray reveals left base consolidation. Mild leukocytosis.  No metabolic derangements. Home medications are minimal. CKD disease  appears stable. Urinalysis with protein.  She's afebrile hemodynamically stable and not hypoxic. Neuro exam without focal defecits -Admit to medical bed -Follow blood cultures -Obtain sputum cultures as able -Strep pneumo urine antigen -Influenza panel -Continue Rocephin and azithromycin -IV fluids -Obtain folate B-12 and RPR -If no improvement consider CT of the head  #2. Elevated troponin. Initial troponin 0.22. No chest pain reported. EKG without acute changes. Likely related to demand -Will cycle troponin -repeat EKG in the morning  #3. Generalized weakness/increased oral intake. Likely secondary to #1 in the setting of what appears to be failure to thrive. -Physical therapy -nutritional consult  #4. Community-acquired pneumonia. Patient with mild leukocytosis afebrile chest x-ray with left base consolidation. hemodynamically stable and not hypoxic. -See #1 -Strep pneumo urine antigen -Follow blood cultures -Sputum cultures as able -Antitussives  #5. Chronic kidney disease stage III. Creatinine 1.5. Chart review indicates this is close to baseline. Urine quite concentrated -Gentle IV fluids -Hold nephrotoxins -Monitor urine output  #6. Elevated transaminase level. Son reports a history of "liver disease". No history of EtOH abuse no history of hepatitis. Alkaline phosphatase ALT AST elevated since April of this year. Total bilirubin 1.3. Ammonia level 15. Etiology unclear. Home meds include statin -hold liver toxins -monitor -may need OP GI consult  #7. Hypertension. Blood pressure controlled in the emergency department. Home medications include amlodipine. -We'll hold amlodipine for today -Resume amlodipine tomorrow -Monitor closely adjust medications as indicated    DVT prophylaxis: heparin Code Status: limited no intubation no compressions no shock  Family Communication: son at bedside  Disposition Plan: home  Consults called: none  Admission status: obs     Dyanne Carrel M MD [facility]  If 7PM-7AM, please contact night-coverage [email or web address] Password [email or web address]  03/02/2016, 8:12 AM

## 2016-03-02 NOTE — ED Notes (Signed)
MD aware of critical lab value.

## 2016-03-02 NOTE — Progress Notes (Signed)
Initial Nutrition Assessment  DOCUMENTATION CODES:  Underweight, Severe malnutrition in context of chronic illness  -believed pt would also meet criteria for severe malnutrition in acute context.   INTERVENTION:   Reccomend ST evaluation.    Ensure Enlive po BID, each supplement provides 350 kcal and 20 grams of protein   Magic cup with L+ D , each supplement provides 290 kcal and 9 grams of protein  NUTRITION DIAGNOSIS:  Malnutrition related to chronic illness, acute illness (Dementia + acute PNA episode) as evidenced by severe depletion of muscle/body fat and a suspected energy intake that met < or equal to 50% of estimated needs for > or equal to 5 days.   GOAL:  Patient will meet greater than or equal to 90% of their needs  MONITOR:  PO intake, Supplement acceptance, Diet advancement, Labs, I & O's  REASON FOR ASSESSMENT:  Consult Poor PO  ASSESSMENT:  82 y/o female PMHx dementia, HTN, HLD, CKD, liver disease. Presented to ED with AMS and weakness. CXR concerning for PNA.    Son is at bedside. He reports that the pt has been having poor PO intake <1 week. At baseline the pt will eat "2 good meals" per day, but this past weak she was "just too weak" and at times was described as "non-responsive". He denied any N/V/C/D or other reasons pt's PO intake may be poor. She took a MVI. Son had recently just began patient on Ensure due to decreased PO intake. She has only had a sip or two of these so far. He noted she drinks a lot of water.  Diet wise, the pt did add salt to her food, though the son has been trying to decrease this.   Son noted the pt has had increased behavior of taking bites of food and then spitting it out. Pt is seen with much coughing and spilling of saliva from oral cavity.  Behaviors may be due to dementia progression. Though basilar PNA is Left, Pt still sounds to be high risk of aspiration.  Son believes pt's UBW is 120 lbs and was surprised to hear she  weighed <100 lbs. When asked if she looks smaller to him, he responded" yes, she looks like a skeleton to me". Reviewed encounter from 4 months ago Where pt was weighed 132 lbs and do not believe this is a reliable value  Physical Exam reveals Severe muscle/fat wasting of upper body.    RD suspects pt's nutrtional decline is related to this acute illness, but more chronically, her dementia progression.   Labs: WBC:10.6, Albumin: 2.7, Elevated liver enzymes, BG ~ 120 mg/dl Medications:  PO abx, Iv abx,    Recent Labs Lab 03/02/16 0532  NA 139  K 4.3  CL 109  CO2 17*  BUN 64*  CREATININE 1.52*  1.58*  CALCIUM 9.9  GLUCOSE 118*   Diet Order:  Diet Heart Room service appropriate? Yes; Fluid consistency: Thin  Skin:  Reviewed, no issues  Last BM:  Unknown  Height:  Ht Readings from Last 1 Encounters:  03/02/16 '5\' 2"'$  (1.575 m)   Weight:  Wt Readings from Last 1 Encounters:  03/02/16 98 lb 3.2 oz (44.5 kg)   Wt Readings from Last 10 Encounters:  03/02/16 98 lb 3.2 oz (44.5 kg)  11/14/15 132 lb (59.9 kg)  11/07/15 132 lb (59.9 kg)   Ideal Body Weight:  50 kg  BMI:  Body mass index is 17.96 kg/m.  Estimated Nutritional Needs:  Kcal:  1350-1550 kcals (30-35 kcal/kg bw) Protein:  62-71 g (1.4-1.6 g/kg bw) Fluid:  >1.1 L (25 ml/kg bw)  EDUCATION NEEDS:  No education needs identified at this time  Burtis Junes RD, LDN, [facility] Pager: [phone] 03/02/2016 10:27 AM

## 2016-03-02 NOTE — ED Triage Notes (Signed)
Per EMS, two days ago pt started being lethargic, not eating. No N&V/D. No pain. Overall not feeling well. 93% room air. 97% on 2l. NSR. EMS VS 155/96, P 88, CBG 133. Hx of kidney & liver failure.

## 2016-03-02 NOTE — ED Provider Notes (Signed)
[facility] DEPT Provider Note   CSN: [id number] Arrival date & time: 03/02/16  0531     History   Chief Complaint Chief Complaint  Patient presents with  . Altered Mental Status   LEVEL 5 CAVEAT DUE TO ALTERED MENTAL STATUS  HPI Jamie Lester is a 82 y.o. female.  The history is provided by a relative.  Altered Mental Status   This is a new problem. The current episode started yesterday. The problem has been gradually worsening. Associated symptoms include confusion and weakness. Her past medical history is significant for liver disease.  Patient with h/o "liver and kidney disease" who presents from home with confusion, decreased PO intake.  No falls No new meds She usually can speak and eat, but over past day this has decreased Son gives all history and he is caretaker of patient She "felt warm" at home   Minimal cough is reported   No vomiting or diarrhea is noted.    Past Medical History:  Diagnosis Date  . Dementia   . Diverticulosis   . Hemorrhoid   . Hypercholesteremia   . Hypertension     Patient Active Problem List   Diagnosis Date Noted  . Anemia of chronic disease 10/19/2015  . Acute lower GI bleeding 07/14/2015  . AKI (acute kidney injury) ([place]) 07/14/2015  . Dementia   . Hypertension   . Hypercholesteremia   . Acute blood loss anemia   . Essential hypertension     Past Surgical History:  Procedure Laterality Date  . CESAREAN SECTION      OB History    No data available       Home Medications    Prior to Admission medications   Medication Sig Start Date End Date Taking? Authorizing Provider  amLODipine (NORVASC) 2.5 MG tablet Take 2.5 mg by mouth daily. 06/26/15   Historical Provider, MD  atorvastatin (LIPITOR) 40 MG tablet Take 40 mg by mouth daily. 07/13/15   Historical Provider, MD  Multiple Vitamin (MULTIVITAMIN WITH MINERALS) TABS tablet Take 1 tablet by mouth daily.    Historical Provider, MD    Family History Family  History  Problem Relation Age of Onset  . Colon cancer Neg Hx     Social History Social History  Substance Use Topics  . Smoking status: Never Smoker  . Smokeless tobacco: Not on file  . Alcohol use No     Allergies   Patient has no known allergies.   Review of Systems Review of Systems  Unable to perform ROS: Mental status change  Neurological: Positive for weakness.  Psychiatric/Behavioral: Positive for confusion.     Physical Exam Updated Vital Signs BP 147/71 (BP Location: Right Arm)   Pulse 86   Temp 98.2 F (36.8 C) (Rectal)   Resp 18   Ht 5\' 2"  (1.575 m)   Wt 56.7 kg   SpO2 97%   BMI 22.86 kg/m   Physical Exam CONSTITUTIONAL: elderly and frail HEAD: Normocephalic/atraumatic EYES: EOMI/PERRL ENMT: Mucous membranes moist NECK: supple no meningeal signs SPINE/BACK:entire spine nontender CV: S1/S2 noted, no murmurs/rubs/gallops noted LUNGS: minimal cough noted.  Crackles left base.  No distress noted ABDOMEN: soft, nontender NEURO: Pt is awake/alert. She is nonverbal.  She follows commands.  She moves all extremities.   EXTREMITIES: pulses normal/equal, full ROM SKIN: warm, color normal PSYCH: unable to assess  ED Treatments / Results  Labs (all labs ordered are listed, but only abnormal results are displayed) Labs Reviewed  COMPREHENSIVE METABOLIC  PANEL - Abnormal; Notable for the following:       Result Value   CO2 17 (*)    Glucose, Bld 118 (*)    BUN 64 (*)    Creatinine, Ser 1.58 (*)    Albumin 2.7 (*)    AST 206 (*)    ALT 196 (*)    Total Bilirubin 1.3 (*)    GFR calc non Af Amer 29 (*)    GFR calc Af Amer 33 (*)    All other components within normal limits  CBC - Abnormal; Notable for the following:    WBC 10.6 (*)    All other components within normal limits  TROPONIN I - Abnormal; Notable for the following:    Troponin I 0.22 (*)    All other components within normal limits  CBG MONITORING, ED - Abnormal; Notable for the  following:    Glucose-Capillary 116 (*)    All other components within normal limits  URINE CULTURE  AMMONIA  URINALYSIS, ROUTINE W REFLEX MICROSCOPIC    EKG  EKG Interpretation  Date/Time:  Saturday March 02 2016 06:32:22 EST Ventricular Rate:  87 PR Interval:    QRS Duration: 88 QT Interval:  345 QTC Calculation: 415 R Axis:   26 Text Interpretation:  Sinus rhythm Anterior infarct, old No significant change since last tracing Confirmed by Christy Gentles  MD, [place] ([id number]) on 03/02/2016 6:41:19 AM       Radiology Dg Chest Portable 1 View  Result Date: 03/02/2016 CLINICAL DATA:  Cough and lethargy for 2 days. EXAM: PORTABLE CHEST 1 VIEW COMPARISON:  None. FINDINGS: There is consolidation in the left base. There is moderate cardiomegaly. No large effusion. Right lung is clear. Pulmonary vasculature is normal. Hilar and mediastinal contours are unremarkable. IMPRESSION: Left base consolidation, suspicious for pneumonia. Followup PA and lateral chest X-ray is recommended in 3-4 weeks following trial of antibiotic therapy to ensure resolution and exclude underlying malignancy. Electronically Signed   By: Andreas Newport M.D.   On: 03/02/2016 06:29    Procedures Procedures (including critical care time)  Medications Ordered in ED Medications  cefTRIAXone (ROCEPHIN) 1 g in dextrose 5 % 50 mL IVPB (not administered)  azithromycin (ZITHROMAX) 500 mg in dextrose 5 % 250 mL IVPB (not administered)  sodium chloride 0.9 % bolus 500 mL (not administered)     Initial Impression / Assessment and Plan / ED Course  I have reviewed the triage vital signs and the nursing notes.  Pertinent labs & imaging results that were available during my care of the patient were reviewed by me and considered in my medical decision making (see chart for details).  Clinical Course     Pt with h/o dementia here for worsening confusion, decreased PO She had some cough while I was examining her CXR  reveals pneumonia She is awake/alert.  She is nonverbal but follows commands and does not have any abdominal tenderness and shakes her head "no" when asked about chest pain Her troponin is elevated but NO ekg changes I suspect this troponin elevation due to demand ischemia Will need medical admission Pt has no signs of acute CVA (no focal weakness noted) 7:11 AM D/w medicine (karen) Will admit to medical bed Pt stable/awake/alert, no distress   Final Clinical Impressions(s) / ED Diagnoses   Final diagnoses:  Community acquired pneumonia of left lower lobe of lung ([place])  Delirium  Dehydration    New Prescriptions New Prescriptions   No medications on file  Ripley Fraise, MD 03/02/16 [phone]

## 2016-03-02 NOTE — ED Notes (Signed)
Admit MD at bedside

## 2016-03-03 DIAGNOSIS — R531 Weakness: Secondary | ICD-10-CM

## 2016-03-03 DIAGNOSIS — E860 Dehydration: Secondary | ICD-10-CM | POA: Diagnosis present

## 2016-03-03 DIAGNOSIS — F0390 Unspecified dementia without behavioral disturbance: Secondary | ICD-10-CM

## 2016-03-03 DIAGNOSIS — R748 Abnormal levels of other serum enzymes: Secondary | ICD-10-CM

## 2016-03-03 DIAGNOSIS — R740 Nonspecific elevation of levels of transaminase and lactic acid dehydrogenase [LDH]: Secondary | ICD-10-CM | POA: Diagnosis not present

## 2016-03-03 DIAGNOSIS — R1311 Dysphagia, oral phase: Secondary | ICD-10-CM | POA: Diagnosis not present

## 2016-03-03 DIAGNOSIS — E43 Unspecified severe protein-calorie malnutrition: Secondary | ICD-10-CM | POA: Diagnosis present

## 2016-03-03 DIAGNOSIS — R627 Adult failure to thrive: Secondary | ICD-10-CM | POA: Diagnosis present

## 2016-03-03 DIAGNOSIS — J189 Pneumonia, unspecified organism: Secondary | ICD-10-CM | POA: Diagnosis present

## 2016-03-03 DIAGNOSIS — J181 Lobar pneumonia, unspecified organism: Secondary | ICD-10-CM | POA: Diagnosis not present

## 2016-03-03 DIAGNOSIS — I129 Hypertensive chronic kidney disease with stage 1 through stage 4 chronic kidney disease, or unspecified chronic kidney disease: Secondary | ICD-10-CM | POA: Diagnosis present

## 2016-03-03 DIAGNOSIS — N183 Chronic kidney disease, stage 3 (moderate): Secondary | ICD-10-CM | POA: Diagnosis present

## 2016-03-03 DIAGNOSIS — K769 Liver disease, unspecified: Secondary | ICD-10-CM | POA: Diagnosis present

## 2016-03-03 DIAGNOSIS — M6281 Muscle weakness (generalized): Secondary | ICD-10-CM | POA: Diagnosis not present

## 2016-03-03 DIAGNOSIS — E7800 Pure hypercholesterolemia, unspecified: Secondary | ICD-10-CM | POA: Diagnosis present

## 2016-03-03 DIAGNOSIS — G9340 Encephalopathy, unspecified: Secondary | ICD-10-CM

## 2016-03-03 DIAGNOSIS — N189 Chronic kidney disease, unspecified: Secondary | ICD-10-CM | POA: Diagnosis not present

## 2016-03-03 DIAGNOSIS — I10 Essential (primary) hypertension: Secondary | ICD-10-CM | POA: Diagnosis not present

## 2016-03-03 DIAGNOSIS — Z681 Body mass index (BMI) 19 or less, adult: Secondary | ICD-10-CM | POA: Diagnosis not present

## 2016-03-03 DIAGNOSIS — I248 Other forms of acute ischemic heart disease: Secondary | ICD-10-CM | POA: Diagnosis present

## 2016-03-03 DIAGNOSIS — G9341 Metabolic encephalopathy: Secondary | ICD-10-CM | POA: Diagnosis present

## 2016-03-03 LAB — COMPREHENSIVE METABOLIC PANEL
ALBUMIN: 2.3 g/dL — AB (ref 3.5–5.0)
ALK PHOS: 70 U/L (ref 38–126)
ALT: 169 U/L — AB (ref 14–54)
AST: 169 U/L — AB (ref 15–41)
Anion gap: 11 (ref 5–15)
BILIRUBIN TOTAL: 0.6 mg/dL (ref 0.3–1.2)
BUN: 58 mg/dL — AB (ref 6–20)
CO2: 18 mmol/L — ABNORMAL LOW (ref 22–32)
CREATININE: 1.18 mg/dL — AB (ref 0.44–1.00)
Calcium: 9.5 mg/dL (ref 8.9–10.3)
Chloride: 110 mmol/L (ref 101–111)
GFR calc Af Amer: 47 mL/min — ABNORMAL LOW (ref >60)
GFR calc non Af Amer: 41 mL/min — ABNORMAL LOW (ref >60)
GLUCOSE: 91 mg/dL (ref 65–99)
POTASSIUM: 4.1 mmol/L (ref 3.5–5.1)
Sodium: 139 mmol/L (ref 135–145)
Total Protein: 6 g/dL — ABNORMAL LOW (ref 6.5–8.1)

## 2016-03-03 LAB — CBC
HEMATOCRIT: 34.5 % — AB (ref 36.0–46.0)
HEMOGLOBIN: 11.1 g/dL — AB (ref 12.0–15.0)
MCH: 29.8 pg (ref 26.0–34.0)
MCHC: 32.2 g/dL (ref 30.0–36.0)
MCV: 92.5 fL (ref 78.0–100.0)
Platelets: 271 10*3/uL (ref 150–400)
RBC: 3.73 MIL/uL — AB (ref 3.87–5.11)
RDW: 15.1 % (ref 11.5–15.5)
WBC: 9 10*3/uL (ref 4.0–10.5)

## 2016-03-03 LAB — RPR: RPR: NONREACTIVE

## 2016-03-03 LAB — URINE CULTURE: CULTURE: NO GROWTH

## 2016-03-03 NOTE — Evaluation (Signed)
Physical Therapy Evaluation Patient Details Name: Jamie Lester MRN: [id number] DOB: 10-09-1928 Today's Date: 03/03/2016   History of Present Illness  82 y.o. female with a Past Medical History significant for HTN, dementia, CKD who presents with AMS and weakness.  Clinical Impression  Pt admitted with above diagnosis. Pt currently with functional limitations due to the deficits listed below (see PT Problem List). On eval, pt required mod to max assist for bed mobility. She was unable to tolerate transfers OOB. Pt will benefit from skilled PT to increase their independence and safety with mobility to allow discharge to the venue listed below.  Family not present during eval. Unsure of level of assist they are able to provide at home. Recommending [facility].      Follow Up Recommendations SNF;Supervision/Assistance - 24 hour    Equipment Recommendations  None recommended by PT    Recommendations for Other Services       Precautions / Restrictions Precautions Precautions: Fall Restrictions Weight Bearing Restrictions: No      Mobility  Bed Mobility Overal bed mobility: Needs Assistance Bed Mobility: Supine to Sit;Sit to Supine;Rolling Rolling: Mod assist   Supine to sit: Max assist;HOB elevated Sit to supine: Max assist;HOB elevated   General bed mobility comments: multi modal cues for sequencing.   Transfers                 General transfer comment: Pt sat EOB ~ 1 minute with mod assist. Pt resisting attempts to maintain further sitting time, pushing back to return to supine.  Ambulation/Gait             General Gait Details: unable   Stairs            Wheelchair Mobility    Modified Rankin (Stroke Patients Only)       Balance                                             Pertinent Vitals/Pain Pain Assessment: Faces Faces Pain Scale: No hurt    Home Living Family/patient expects to be discharged to:: Private  residence Living Arrangements: Children Available Help at Discharge: Family Type of Home: House       Home Layout: Two level   Additional Comments: Pt is a poor historian. History taken from previous admission. She lives with her son who is not present at time of eval.    Prior Function Level of Independence: Needs assistance   Gait / Transfers Assistance Needed: ambulates short distances with RW           Hand Dominance        Extremity/Trunk Assessment                         Communication   Communication: Expressive difficulties  Cognition Arousal/Alertness: Awake/alert Behavior During Therapy: Flat affect Overall Cognitive Status: No family/caregiver present to determine baseline cognitive functioning                 General Comments: Nonverbal on eval. Follows one step commands with increased time.    General Comments      Exercises     Assessment/Plan    PT Assessment Patient needs continued PT services  PT Problem List Decreased strength;Decreased activity tolerance;Decreased balance;Decreased cognition;Decreased mobility  PT Treatment Interventions Gait training;Functional mobility training;Balance training;Therapeutic exercise;Therapeutic activities;Cognitive remediation;Patient/family education    PT Goals (Current goals can be found in the Care Plan section)  Acute Rehab PT Goals Patient Stated Goal: not stated PT Goal Formulation: Patient unable to participate in goal setting Time For Goal Achievement: 03/17/16 Potential to Achieve Goals: Fair    Frequency Min 3X/week   Barriers to discharge        Co-evaluation               End of Session   Activity Tolerance: Patient limited by fatigue Patient left: in bed;with bed alarm set;with call bell/phone within reach Nurse Communication: Mobility status    Functional Assessment Tool Used: clinical judgement Functional Limitation: Mobility: Walking and  moving around Mobility: Walking and Moving Around Current Status [id number]): At least 80 percent but less than 100 percent impaired, limited or restricted Mobility: Walking and Moving Around Goal Status [phone]): At least 40 percent but less than 60 percent impaired, limited or restricted    Time: [id number] PT Time Calculation (min) (ACUTE ONLY): 25 min   Charges:   PT Evaluation $PT Eval Moderate Complexity: 1 Procedure PT Treatments $Therapeutic Activity: 8-22 mins   PT G Codes:   PT G-Codes **NOT FOR INPATIENT CLASS** Functional Assessment Tool Used: clinical judgement Functional Limitation: Mobility: Walking and moving around Mobility: Walking and Moving Around Current Status [id number]): At least 80 percent but less than 100 percent impaired, limited or restricted Mobility: Walking and Moving Around Goal Status [phone]): At least 40 percent but less than 60 percent impaired, limited or restricted    [name] 03/03/2016, 11:27 AM

## 2016-03-03 NOTE — Progress Notes (Signed)
PROGRESS NOTE  Jamie Lester  [id number] DOB: June 26, 1928 DOA: 03/02/2016 PCP: Antony Blackbird, MD Outpatient Specialists:  Subjective: Only answered question about her name, appears comfortable She is demented.  Brief Narrative:  Jamie Lester is a 82 y.o. female with medical history significant dementia, hypertension, hyperlipidemia chronic kidney disease, GI bleed, liver disease presents to emergency Department chief complaint of altered mental status generalized weakness. Initial evaluation reveals a chest x-ray concerning for pneumonia sitting liver function tests elevated troponin leukocytosis.  Information is obtained from the son who is primary caregiver. Reports patient in his usual state of health until about 2 days ago she developed gradual worsening cough with decreased oral intake. Yesterday she developed confusion and lethargy. She does have baseline dementia but son reports she is usually verbally interactive and is able to make her once a needs known. She typically ambulates with a walker but has been too weak to do so over the last several days. Son also reported frequent coughing productive with thick sputum. There is been no vomiting no diarrhea. He reports she "felt warm". No complaints of dysuria hematuria frequency or urgency. No complaints of headache dizziness syncope or near-syncope. Patient is alert but nonverbal. She will follow commands.   Assessment & Plan:   Principal Problem:   Acute encephalopathy Active Problems:   Dementia   Hypercholesteremia   Essential hypertension   PNA (pneumonia)   CKD (chronic kidney disease)   Elevated troponin   Generalized weakness   Elevated transaminase level   Protein-calorie malnutrition, severe   Community-acquired pneumonia -Patient with mild leukocytosis, CXR with left base consolidation. hemodynamically stable and not hypoxic. -Flu PCR is negative, urine for Streptococcus antigen and blood cultures  done. -On this Flomax and Rocephin, continued. -Continue supportive management with bronchodilators, mucolytics, antitussives and oxygen as needed.  Acute encephalopathy in the setting of moderate dementia  -Likely related to pneumonia and dehydration.  -Obtain folate B-12 and RPR, If no improvement consider CT of the head.  -Unclear to me what her baseline, appears comfortable oriented to self only.  Elevated troponin -Initial troponin 0.22. No chest pain reported. EKG without acute changes. Likely related to demand -Will cycle troponin -repeat EKG in the morning  Generalized weakness/increased oral intake -Likely secondary to #1 in the setting of what appears to be failure to thrive. -Physical therapy -nutritional consult  Chronic kidney disease stage III -Creatinine 1.5. Chart review indicates this is close to baseline. Urine quite concentrated -Gentle IV fluids -Hold nephrotoxins -Monitor urine output  Elevated transaminase level -Son reports a history of "liver disease". No history of EtOH abuse no history of hepatitis. Alkaline phosphatase ALT AST elevated since April of this year. Total bilirubin 1.3. Ammonia level 15. Etiology unclear. Home meds include statin -Monitor  Hypertension -Blood pressure controlled in the emergency department. Home medications include amlodipine. -We'll hold amlodipine for today -Resume amlodipine tomorrow -Monitor closely adjust medications as indicated   DVT prophylaxis: Subcutaneous heparin Code Status: Partial Code Family Communication:  Disposition Plan:  Diet: Diet Heart Room service appropriate? Yes; Fluid consistency: Thin  Consultants:   None  Procedures:   None  Antimicrobials:   Azithromycin and Rocephin  Objective: Vitals:   03/02/16 2056 03/03/16 0519 03/03/16 0845 03/03/16 0922  BP: 140/65 (!) 156/69 (!) 151/72   Pulse: 83 72 74   Resp: 18 18    Temp: 98.6 F (37 C) 98 F (36.7 C)    TempSrc: Oral  Oral    SpO2:  97% 97% 98% 98%  Weight:      Height:        Intake/Output Summary (Last 24 hours) at 03/03/16 1110 Last data filed at 03/03/16 0900  Gross per 24 hour  Intake           486.25 ml  Output                0 ml  Net           486.25 ml   Filed Weights   03/02/16 0527 03/02/16 0810  Weight: 56.7 kg (125 lb) 44.5 kg (98 lb 3.2 oz)    Examination: General exam: Appears calm and comfortable  Respiratory system: Clear to auscultation. Respiratory effort normal. Cardiovascular system: S1 & S2 heard, RRR. No JVD, murmurs, rubs, gallops or clicks. No pedal edema. Gastrointestinal system: Abdomen is nondistended, soft and nontender. No organomegaly or masses felt. Normal bowel sounds heard. Central nervous system: Alert and oriented. No focal neurological deficits. Extremities: Symmetric 5 x 5 power. Skin: No rashes, lesions or ulcers Psychiatry: Judgement and insight appear normal. Mood & affect appropriate.   Data Reviewed: I have personally reviewed following labs and imaging studies  CBC:  Recent Labs Lab 03/02/16 0532 03/03/16 0441  WBC 10.6* 9.0  HGB 12.8 11.1*  HCT 37.0 34.5*  MCV 91.1 92.5  PLT [phone]   Basic Metabolic Panel:  Recent Labs Lab 03/02/16 0532 03/02/16 1155 03/03/16 0441  NA 139  --  139  K 4.3  --  4.1  CL 109  --  110  CO2 17*  --  18*  GLUCOSE 118*  --  91  BUN 64*  --  58*  CREATININE 1.52*  1.58*  --  1.18*  CALCIUM 9.9  --  9.5  MG  --  2.5*  --   PHOS  --  3.6  --    GFR: Estimated Creatinine Clearance: 24.5 mL/min (by C-G formula based on SCr of 1.18 mg/dL (H)). Liver Function Tests:  Recent Labs Lab 03/02/16 0532 03/03/16 0441  AST 206* 169*  ALT 196* 169*  ALKPHOS 77 70  BILITOT 1.3* 0.6  PROT 6.7 6.0*  ALBUMIN 2.7* 2.3*   No results for input(s): LIPASE, AMYLASE in the last 168 hours.  Recent Labs Lab 03/02/16 0532  AMMONIA 15   Coagulation Profile:  Recent Labs Lab 03/02/16 1155  INR 1.20    Cardiac Enzymes:  Recent Labs Lab 03/02/16 0532 03/02/16 1155  TROPONINI 0.22* 0.21*   BNP (last 3 results) No results for input(s): PROBNP in the last 8760 hours. HbA1C: No results for input(s): HGBA1C in the last 72 hours. CBG:  Recent Labs Lab 03/02/16 0536  GLUCAP 116*   Lipid Profile: No results for input(s): CHOL, HDL, LDLCALC, TRIG, CHOLHDL, LDLDIRECT in the last 72 hours. Thyroid Function Tests: No results for input(s): TSH, T4TOTAL, FREET4, T3FREE, THYROIDAB in the last 72 hours. Anemia Panel:  Recent Labs  03/02/16 1155  VITAMINB12 766   Urine analysis:    Component Value Date/Time   COLORURINE YELLOW 03/02/2016 0605   APPEARANCEUR HAZY (A) 03/02/2016 0605   LABSPEC 1.020 03/02/2016 0605   PHURINE 5.0 03/02/2016 0605   GLUCOSEU NEGATIVE 03/02/2016 0605   HGBUR LARGE (A) 03/02/2016 0605   BILIRUBINUR NEGATIVE 03/02/2016 0605   KETONESUR NEGATIVE 03/02/2016 0605   PROTEINUR 30 (A) 03/02/2016 0605   NITRITE NEGATIVE 03/02/2016 0605   LEUKOCYTESUR NEGATIVE 03/02/2016 0605   Sepsis Labs: @LABRCNTIP (procalcitonin:4,lacticidven:4)  )  Recent Results (from the past 240 hour(s))  Urine culture     Status: None   Collection Time: 03/02/16  6:05 AM  Result Value Ref Range Status   Specimen Description URINE, CATHETERIZED  Final   Special Requests NONE  Final   Culture NO GROWTH  Final   Report Status 03/03/2016 FINAL  Final     Invalid input(s): PROCALCITONIN, LACTICACIDVEN   Radiology Studies: Ct Head Wo Contrast  Result Date: 03/02/2016 CLINICAL DATA:  Increased lethargy and weakness for 2 days. EXAM: CT HEAD WITHOUT CONTRAST TECHNIQUE: Contiguous axial images were obtained from the base of the skull through the vertex without intravenous contrast. COMPARISON:  07/14/2015 FINDINGS: Brain: Stable marked brain atrophy and chronic white matter microvascular ischemic changes throughout the cerebral hemispheres. No acute intracranial hemorrhage,  definite new infarction, mass lesion, midline shift, herniation, hydrocephalus, or extra-axial fluid collection. No focal mass effect or edema. Cisterns are patent. Cerebellar atrophy as well. Vascular: No hyperdense vessel or unexpected calcification. Skull: Normal. Negative for fracture or focal lesion. Sinuses/Orbits: No acute finding. Other: None. IMPRESSION: Stable chronic atrophy and microvascular white matter ischemic changes. No interval change or acute process by noncontrast CT. Electronically Signed   By: Jerilynn Mages.  Shick M.D.   On: 03/02/2016 12:30   Dg Chest Portable 1 View  Result Date: 03/02/2016 CLINICAL DATA:  Cough and lethargy for 2 days. EXAM: PORTABLE CHEST 1 VIEW COMPARISON:  None. FINDINGS: There is consolidation in the left base. There is moderate cardiomegaly. No large effusion. Right lung is clear. Pulmonary vasculature is normal. Hilar and mediastinal contours are unremarkable. IMPRESSION: Left base consolidation, suspicious for pneumonia. Followup PA and lateral chest X-ray is recommended in 3-4 weeks following trial of antibiotic therapy to ensure resolution and exclude underlying malignancy. Electronically Signed   By: Andreas Newport M.D.   On: 03/02/2016 06:29        Scheduled Meds: . amLODipine  2.5 mg Oral Daily  . azithromycin  500 mg Oral Q24H  . cefTRIAXone (ROCEPHIN)  IV  1 g Intravenous Q24H  . feeding supplement (ENSURE ENLIVE)  237 mL Oral BID BM  . heparin  5,000 Units Subcutaneous Q8H  . ipratropium-albuterol  3 mL Nebulization BID   Continuous Infusions:   LOS: 0 days    Time spent: 35 minutes    Larenzo Caples A, MD [facility] Pager [phone]  If 7PM-7AM, please contact night-coverage [email or web address] Password [email or web address] 03/03/2016, 11:10 AM

## 2016-03-03 NOTE — Care Management Note (Addendum)
Case Management Note  Patient Details  Name: Jamie Lester MRN: [id number] Date of Birth: 1929-03-15  Subjective/Objective:        Admitted with Acute encephalopathy, hx of HTN, dementia, CKD. From home with husband and son,[name]. Son is the caretaker for parents.        [address] [place] (Son)     [phone]       PCP: Antony Blackbird  Action/Plan:  SNF vs home health. CM to f/u with disposition needs.  Expected Discharge Date:                  Expected Discharge Plan:     In-House Referral:   CSW  Discharge planning Services     Post Acute Care Choice:    Choice offered to:     DME Arranged:    DME Agency:     HH Arranged:    HH Agency:     Status of Service:     If discussed at [employer], dates discussed:    Additional Comments:  Sharin Mons, RN 03/03/2016, 10:34 PM

## 2016-03-04 LAB — FOLATE RBC
FOLATE, RBC: 1125 ng/mL (ref >498)
Folate, Hemolysate: 430.7 ng/mL
Hematocrit: 38.3 % (ref 34.0–46.6)

## 2016-03-04 NOTE — NC FL2 (Signed)
[place] MEDICAID FL2 LEVEL OF CARE SCREENING TOOL     IDENTIFICATION  Patient Name: Jamie Lester Birthdate: May 13, 1928 Sex: female Admission Date (Current Location): 03/02/2016  [facility] and [place] Number:  Herbalist and Address:  [facility] [place], [place] [address] [place], [place] [address]      Provider Number: [id number]  Attending Physician Name and Address:  Verlee Monte, MD  Relative Name and Phone Number:  [facility], son    Current Level of Care: Hospital Recommended Level of Care: [place] Prior Approval Number:    Date Approved/Denied:   PASRR Number: [id number]  Discharge Plan: SNF    Current Diagnoses: Patient Active Problem List   Diagnosis Date Noted  . Acute encephalopathy 03/02/2016  . PNA (pneumonia) 03/02/2016  . Elevated troponin 03/02/2016  . Generalized weakness 03/02/2016  . Elevated transaminase level 03/02/2016  . Protein-calorie malnutrition, severe 03/02/2016  . Liver disease   . CKD (chronic kidney disease)   . Anemia of chronic disease 10/19/2015  . Acute lower GI bleeding 07/14/2015  . AKI (acute kidney injury) ([place]) 07/14/2015  . Dementia   . Hypertension   . Hypercholesteremia   . Acute blood loss anemia   . Essential hypertension     Orientation RESPIRATION BLADDER Height & Weight     Self  Normal Incontinent Weight: 44.5 kg (98 lb 3.2 oz) Height:  5\' 2"  (157.5 cm)  BEHAVIORAL SYMPTOMS/MOOD NEUROLOGICAL BOWEL NUTRITION STATUS      Continent Diet (Please see DC Summary)  AMBULATORY STATUS COMMUNICATION OF NEEDS Skin   Extensive Assist Verbally Normal                       Personal Care Assistance Level of Assistance  Bathing, Feeding, Dressing Bathing Assistance: Maximum assistance Feeding assistance: Maximum assistance Dressing Assistance: Maximum assistance     Functional Limitations Info             SPECIAL CARE FACTORS FREQUENCY  PT (By licensed PT)      PT Frequency: 5x/week              Contractures      Additional Factors Info  Code Status, Allergies Code Status Info: Partial Allergies Info: NKA           Current Medications (03/04/2016):  This is the current hospital active medication list Current Facility-Administered Medications  Medication Dose Route Frequency Provider Last Rate Last Dose  . albuterol (PROVENTIL) (2.5 MG/3ML) 0.083% nebulizer solution 2.5 mg  2.5 mg Nebulization Q6H PRN Elwin Mocha, MD      . amLODipine [facility]) tablet 2.5 mg  2.5 mg Oral Daily Radene Gunning, NP   2.5 mg at 03/04/16 [id number]  . azithromycin (ZITHROMAX) tablet 500 mg  500 mg Oral Q24H Elwin Mocha, MD   500 mg at 03/04/16 [id number]  . cefTRIAXone (ROCEPHIN) 1 g in dextrose 5 % 50 mL IVPB  1 g Intravenous Q24H Elwin Mocha, MD   1 g at 03/04/16 [id number]  . feeding supplement (ENSURE ENLIVE) (ENSURE ENLIVE) liquid 237 mL  237 mL Oral BID BM Elwin Mocha, MD   237 mL at 03/04/16 0921  . heparin injection 5,000 Units  5,000 Units Subcutaneous Q8H Radene Gunning, NP   5,000 Units at 03/04/16 0536  . ipratropium-albuterol (DUONEB) 0.5-2.5 (3) MG/3ML nebulizer solution 3 mL  3 mL Nebulization BID Elwin Mocha, MD   3  mL at 03/04/16 0938  . ondansetron (ZOFRAN) tablet 4 mg  4 mg Oral Q6H PRN Radene Gunning, NP       Or  . ondansetron [facility]) injection 4 mg  4 mg Intravenous Q6H PRN Radene Gunning, NP      . senna-docusate (Senokot-S) tablet 1 tablet  1 tablet Oral QHS PRN Radene Gunning, NP      . traZODone (DESYREL) tablet 25 mg  25 mg Oral QHS PRN Radene Gunning, NP         Discharge Medications: Please see discharge summary for a list of discharge medications.  Relevant Imaging Results:  Relevant Lab Results:   Additional Information SSN: [place], [place]

## 2016-03-04 NOTE — Progress Notes (Signed)
PROGRESS NOTE  Jamie Lester  [id number] DOB: [birth year] DOA: 03/02/2016 PCP: Antony Blackbird, MD Outpatient Specialists:  Subjective: Oriented only to self, appears comfortable without complaints. Demented.  Brief Narrative:  Jamie Lester is a 82 y.o. female with medical history significant dementia, hypertension, hyperlipidemia chronic kidney disease, GI bleed, liver disease presents to emergency Department chief complaint of altered mental status generalized weakness. Initial evaluation reveals a chest x-ray concerning for pneumonia sitting liver function tests elevated troponin leukocytosis.  Information is obtained from the son who is primary caregiver. Reports patient in his usual state of health until about 2 days ago she developed gradual worsening cough with decreased oral intake. Yesterday she developed confusion and lethargy. She does have baseline dementia but son reports she is usually verbally interactive and is able to make her once a needs known. She typically ambulates with a walker but has been too weak to do so over the last several days. Son also reported frequent coughing productive with thick sputum. There is been no vomiting no diarrhea. He reports she "felt warm". No complaints of dysuria hematuria frequency or urgency. No complaints of headache dizziness syncope or near-syncope. Patient is alert but nonverbal. She will follow commands.   Assessment & Plan:   Principal Problem:   Acute encephalopathy Active Problems:   Dementia   Hypercholesteremia   Essential hypertension   PNA (pneumonia)   CKD (chronic kidney disease)   Elevated troponin   Generalized weakness   Elevated transaminase level   Protein-calorie malnutrition, severe   Community-acquired pneumonia -Patient with mild leukocytosis, CXR with left base consolidation. hemodynamically stable and not hypoxic. -Flu PCR is negative, urine for Streptococcus antigen and blood cultures  done. -On Zithromax and Rocephin, continued. -Continue supportive management with bronchodilators, mucolytics, antitussives and oxygen as needed. -No changes made to her respiratory regimen  Acute encephalopathy in the setting of moderate dementia  -Likely related to pneumonia and dehydration.  -Obtain folate B-12 and RPR, If no improvement consider CT of the head.  -Unclear to me what her baseline, appears comfortable oriented to self only.  Elevated troponin -Initial troponin 0.22. No chest pain reported. EKG without acute changes. Likely related to demand -Will cycle troponin -repeat EKG in the morning  Generalized weakness/increased oral intake -Likely secondary to #1 in the setting of what appears to be failure to thrive. -Physical therapy -nutritional consult  Chronic kidney disease stage III -Creatinine 1.5. Chart review indicates this is close to baseline. Urine quite concentrated -Gentle IV fluids -Hold nephrotoxins -Monitor urine output  Elevated transaminase level -Son reports a history of "liver disease". No history of EtOH abuse no history of hepatitis. Alkaline phosphatase ALT AST elevated since April of this year. Total bilirubin 1.3. Ammonia level 15. Etiology unclear. Home meds include statin -Monitor  Hypertension -Blood pressure controlled in the emergency department. Home medications include amlodipine. -We'll hold amlodipine for today -Resume amlodipine tomorrow -Monitor closely adjust medications as indicated   DVT prophylaxis: Subcutaneous heparin Code Status: Partial Code Family Communication:  Disposition Plan:  Diet: Diet Heart Room service appropriate? Yes; Fluid consistency: Thin  Consultants:   None  Procedures:   None  Antimicrobials:   Azithromycin and Rocephin  Objective: Vitals:   03/03/16 1814 03/03/16 2226 03/04/16 0557 03/04/16 0938  BP:  139/66 (!) 157/82   Pulse:  82 66   Resp:  18 16   Temp:  98.2 F (36.8 C)  97.9 F (36.6 C)   TempSrc:  Oral Oral  SpO2: 92% 96% 97% 93%  Weight:      Height:       No intake or output data in the 24 hours ending 03/04/16 1205 Filed Weights   03/02/16 0527 03/02/16 0810  Weight: 56.7 kg (125 lb) 44.5 kg (98 lb 3.2 oz)    Examination: General exam: Appears calm and comfortable  Respiratory system: Clear to auscultation. Respiratory effort normal. Cardiovascular system: S1 & S2 heard, RRR. No JVD, murmurs, rubs, gallops or clicks. No pedal edema. Gastrointestinal system: Abdomen is nondistended, soft and nontender. No organomegaly or masses felt. Normal bowel sounds heard. Central nervous system: Alert and oriented. No focal neurological deficits. Extremities: Symmetric 5 x 5 power. Skin: No rashes, lesions or ulcers Psychiatry: Judgement and insight appear normal. Mood & affect appropriate.   Data Reviewed: I have personally reviewed following labs and imaging studies  CBC:  Recent Labs Lab 03/02/16 0532 03/03/16 0441  WBC 10.6* 9.0  HGB 12.8 11.1*  HCT 37.0 34.5*  MCV 91.1 92.5  PLT 273 [id number]   Basic Metabolic Panel:  Recent Labs Lab 03/02/16 0532 03/02/16 1155 03/03/16 0441  NA 139  --  139  K 4.3  --  4.1  CL 109  --  110  CO2 17*  --  18*  GLUCOSE 118*  --  91  BUN 64*  --  58*  CREATININE 1.52*  1.58*  --  1.18*  CALCIUM 9.9  --  9.5  MG  --  2.5*  --   PHOS  --  3.6  --    GFR: Estimated Creatinine Clearance: 24.5 mL/min (by C-G formula based on SCr of 1.18 mg/dL (H)). Liver Function Tests:  Recent Labs Lab 03/02/16 0532 03/03/16 0441  AST 206* 169*  ALT 196* 169*  ALKPHOS 77 70  BILITOT 1.3* 0.6  PROT 6.7 6.0*  ALBUMIN 2.7* 2.3*   No results for input(s): LIPASE, AMYLASE in the last 168 hours.  Recent Labs Lab 03/02/16 0532  AMMONIA 15   Coagulation Profile:  Recent Labs Lab 03/02/16 1155  INR 1.20   Cardiac Enzymes:  Recent Labs Lab 03/02/16 0532 03/02/16 1155  TROPONINI 0.22* 0.21*   BNP  (last 3 results) No results for input(s): PROBNP in the last 8760 hours. HbA1C: No results for input(s): HGBA1C in the last 72 hours. CBG:  Recent Labs Lab 03/02/16 0536  GLUCAP 116*   Lipid Profile: No results for input(s): CHOL, HDL, LDLCALC, TRIG, CHOLHDL, LDLDIRECT in the last 72 hours. Thyroid Function Tests: No results for input(s): TSH, T4TOTAL, FREET4, T3FREE, THYROIDAB in the last 72 hours. Anemia Panel:  Recent Labs  03/02/16 1155  VITAMINB12 766   Urine analysis:    Component Value Date/Time   COLORURINE YELLOW 03/02/2016 0605   APPEARANCEUR HAZY (A) 03/02/2016 0605   LABSPEC 1.020 03/02/2016 0605   PHURINE 5.0 03/02/2016 0605   GLUCOSEU NEGATIVE 03/02/2016 0605   HGBUR LARGE (A) 03/02/2016 0605   BILIRUBINUR NEGATIVE 03/02/2016 0605   KETONESUR NEGATIVE 03/02/2016 0605   PROTEINUR 30 (A) 03/02/2016 0605   NITRITE NEGATIVE 03/02/2016 0605   LEUKOCYTESUR NEGATIVE 03/02/2016 0605   Sepsis Labs: @LABRCNTIP (procalcitonin:4,lacticidven:4)  ) Recent Results (from the past 240 hour(s))  Urine culture     Status: None   Collection Time: 03/02/16  6:05 AM  Result Value Ref Range Status   Specimen Description URINE, CATHETERIZED  Final   Special Requests NONE  Final   Culture NO GROWTH  Final   Report  Status 03/03/2016 FINAL  Final  Culture, blood (routine x 2) Call MD if unable to obtain prior to antibiotics being given     Status: None (Preliminary result)   Collection Time: 03/02/16  7:35 AM  Result Value Ref Range Status   Specimen Description BLOOD LEFT HAND  Final   Special Requests BOTTLES DRAWN AEROBIC AND ANAEROBIC 5CC  Final   Culture NO GROWTH 1 DAY  Final   Report Status PENDING  Incomplete  Culture, blood (routine x 2) Call MD if unable to obtain prior to antibiotics being given     Status: None (Preliminary result)   Collection Time: 03/02/16  7:40 AM  Result Value Ref Range Status   Specimen Description BLOOD RIGHT HAND  Final   Special  Requests BOTTLES DRAWN AEROBIC AND ANAEROBIC 5CC  Final   Culture NO GROWTH 1 DAY  Final   Report Status PENDING  Incomplete     Invalid input(s): PROCALCITONIN, [place]   Radiology Studies: No results found.      Scheduled Meds: . amLODipine  2.5 mg Oral Daily  . azithromycin  500 mg Oral Q24H  . cefTRIAXone (ROCEPHIN)  IV  1 g Intravenous Q24H  . feeding supplement (ENSURE ENLIVE)  237 mL Oral BID BM  . heparin  5,000 Units Subcutaneous Q8H  . ipratropium-albuterol  3 mL Nebulization BID   Continuous Infusions:   LOS: 1 day    Time spent: 35 minutes    Pennye Beeghly A, MD [facility] Pager [phone]  If 7PM-7AM, please contact night-coverage [email or web address] Password [email or web address] 03/04/2016, 12:05 PM

## 2016-03-04 NOTE — Clinical Social Work Note (Signed)
Clinical Social Work Assessment  Patient Details  Name: Jamie Lester MRN: [id number] Date of Birth: 1928/11/20  Date of referral:  03/04/16               Reason for consult:  Facility Placement                Permission sought to share information with:  Facility Sport and exercise psychologist, Family Supports Permission granted to share information::  No (Patient disoriented)  Name::     Jamie Lester::  SNFs  Relationship::  Son  Contact Information:  [phone]  Housing/Transportation Living arrangements for the past 2 months:  Single Family Home Source of Information:  Adult Children Patient Interpreter Needed:  None Criminal Activity/Legal Involvement Pertinent to Current Situation/Hospitalization:  No - Comment as needed Significant Relationships:  Adult Children, Spouse Lives with:  Adult Children, Spouse Do you feel safe going back to the place where you live?  Yes Need for family participation in patient care:  Yes (Comment)  Care giving concerns:  CSW received consult for possible SNF placement at time of discharge. Patient is disoriented. CSW spoke with patient's son regarding PT recommendation of SNF placement at time of discharge. Per patient's son, patient and her spouse both live with him. He would like some extra care at home, though he will also consider snf. Patient's son expressed understanding of PT recommendation and may be agreeable to SNF placement at time of discharge. CSW to continue to follow and assist with discharge planning needs.   Social Worker assessment / plan:  CSW spoke with patient's son concerning possibility of rehab at [facility] before returning home.  Employment status:  Retired Forensic scientist:  Medicare PT Recommendations:  [place] / Referral to community resources:  [place]  Patient/Family's Response to care:  Patient's son recognizes need for rehab before returning home and may be agreeable  to a SNF in [place]. He currently has the flu, but is available by phone.  Patient/Family's Understanding of and Emotional Response to Diagnosis, Current Treatment, and Prognosis:  Patient/family is realistic regarding therapy needs and expressed being hopeful for SNF placement. Patient's son expressed understanding of CSW role and discharge process. No questions/concerns about plan or treatment.    Emotional Assessment Appearance:    Attitude/Demeanor/Rapport:  Unable to Assess Affect (typically observed):  Unable to Assess Orientation:  Oriented to Self Alcohol / Substance use:  Not Applicable Psych involvement (Current and /or in the community):  No (Comment)  Discharge Needs  Concerns to be addressed:  Care Coordination Readmission within the last 30 days:  No Current discharge risk:  None Barriers to Discharge:  Continued Medical Work up   [employer], [place] 03/04/2016, 2:26 PM

## 2016-03-04 NOTE — Evaluation (Signed)
Clinical/Bedside Swallow Evaluation Patient Details  Name: NYOKA CLIPPARD MRN: [id number] Date of Birth: 1928-09-22  Today's Date: 03/04/2016 Time: SLP Start Time (ACUTE ONLY): 1110 SLP Stop Time (ACUTE ONLY): 1122 SLP Time Calculation (min) (ACUTE ONLY): 12 min  Past Medical History:  Past Medical History:  Diagnosis Date  . CKD (chronic kidney disease)   . Dementia   . Diverticulosis   . Hemorrhoid   . Hypercholesteremia   . Hypertension   . Liver disease    Past Surgical History:  Past Surgical History:  Procedure Laterality Date  . CESAREAN SECTION     HPI:  Haily Houlahan Pettitis a 82 y.o.femalewith medical history significant dementia, hypertension, hyperlipidemia chronic kidney disease, GI bleed, liver disease presents to emergency Department chief complaint of altered mental status generalized weakness. Initial evaluation reveals a chest x-ray concerning for pneumonia. Son reports patient in his usual state of health until about 2 days ago she developed gradual worsening cough with decreased oral intake. Yesterday she developed confusion and lethargy. She does have baseline dementia but son reports she is usually verbally interactive and is able to make her needs known. She typically ambulates with a walker but has been too weak to do so over the last several days. Son also reported frequent coughing productive with thick sputum.  Portable anterior CXR shows left base consolidation.    Assessment / Plan / Recommendation Clinical Impression  Pt demonstrates no overt signs of aspiration with regular solids and thin liquids. Initially, pts voice wet, but after SLP gave multiple cues for pt to clear suspected mucous in throat, voice was clear and dry and remained that way after all textures given. Pt did demonstrate mild oral dyspahgia due to missing dentition with anterior mastication and mild oral residuals on top denture, which cleared with liquid washes. Recommend pt  continue current diet. No SLP f/u needed will sign off.     Aspiration Risk  Mild aspiration risk    Diet Recommendation Regular;Thin liquid   Liquid Administration via: Cup;Straw Medication Administration: Whole meds with liquid Supervision: Patient able to self feed Compensations: Slow rate;Small sips/bites Postural Changes: Seated upright at 90 degrees    Other  Recommendations     Follow up Recommendations        Frequency and Duration            Prognosis        Swallow Study   General HPI: Elecia Rehagen Pettitis a 82 y.o.femalewith medical history significant dementia, hypertension, hyperlipidemia chronic kidney disease, GI bleed, liver disease presents to emergency Department chief complaint of altered mental status generalized weakness. Initial evaluation reveals a chest x-ray concerning for pneumonia. Son reports patient in his usual state of health until about 2 days ago she developed gradual worsening cough with decreased oral intake. Yesterday she developed confusion and lethargy. She does have baseline dementia but son reports she is usually verbally interactive and is able to make her needs known. She typically ambulates with a walker but has been too weak to do so over the last several days. Son also reported frequent coughing productive with thick sputum.  Portable anterior CXR shows left base consolidation.  Type of Study: Bedside Swallow Evaluation Previous Swallow Assessment: none Diet Prior to this Study: Regular;Thin liquids Temperature Spikes Noted: No Respiratory Status: Room air History of Recent Intubation: No Behavior/Cognition: Alert;Cooperative;Requires cueing Oral Cavity Assessment: Within Functional Limits Oral Care Completed by SLP: No Oral Cavity - Dentition: Dentures, top;Poor condition;Missing dentition  Vision: Functional for self-feeding Self-Feeding Abilities: Able to feed self Patient Positioning: Upright in bed Baseline Vocal Quality:  Wet Volitional Cough: Weak (can only thrat clear, not effective) Volitional Swallow: Able to elicit    Oral/Motor/Sensory Function     Ice Chips Ice chips: Not tested   Thin Liquid Thin Liquid: Within functional limits Presentation: Cup;Straw    Nectar Thick Nectar Thick Liquid: Not tested   Honey Thick Honey Thick Liquid: Not tested   Puree Puree: Within functional limits Presentation: Spoon   Solid   GO   Solid: Impaired Presentation: Self Fed Oral Phase Impairments: Impaired mastication Oral Phase Functional Implications: Oral residue       Herbie Baltimore, MA CCC-SLP [phone]  Keara Pagliarulo, Katherene Ponto 03/04/2016,11:37 AM

## 2016-03-05 DIAGNOSIS — E7800 Pure hypercholesterolemia, unspecified: Secondary | ICD-10-CM

## 2016-03-05 NOTE — Consult Note (Signed)
[facility] CM Primary Care Navigator  03/05/2016  SAMAURI KELLENBERGER 02-Dec-1928 [id number]  Met with patient at the bedside to identify possible discharge needs. Patient has dementia but oriented to self.  She states "got so weak that I could not walk" that had led to this admission.  Electronic medical record reveals that Dr. Antony Blackbird with [facility] as her primary care provider.    Patient stated using [facility] to obtain medications.   Per MD note, patient's son is primary caregiver at home.  Patient mentioned that son provides transportation for her.  Discharge plan is to skilled nursing facility per PT recommendation. CSW note states that son had recognized need for rehabilitation prior to returning home and may be agreeable to SNF in [facility].  Patient was provided a letter to remind family to call primary care provider's office when she gets back home, for a post discharge follow-up appointment .   For additional questions please contact:  Edwena Felty A. Bernadene Garside, BSN, RN-BC [facility] PRIMARY CARE Navigator Cell: [phone]

## 2016-03-05 NOTE — Progress Notes (Signed)
PROGRESS NOTE  Jamie Lester  [id number] DOB: 1928/12/24 DOA: 03/02/2016 PCP: Antony Blackbird, MD Outpatient Specialists:  Subjective: No changes clinically since yesterday, oriented only to self, she is demented. Appears comfortable without complaints.  Brief Narrative:  Jamie Lester is a 82 y.o. female with medical history significant dementia, hypertension, hyperlipidemia chronic kidney disease, GI bleed, liver disease presents to emergency Department chief complaint of altered mental status generalized weakness. Initial evaluation reveals a chest x-ray concerning for pneumonia sitting liver function tests elevated troponin leukocytosis.  Information is obtained from the son who is primary caregiver. Reports patient in his usual state of health until about 2 days ago she developed gradual worsening cough with decreased oral intake. Yesterday she developed confusion and lethargy. She does have baseline dementia but son reports she is usually verbally interactive and is able to make her once a needs known. She typically ambulates with a walker but has been too weak to do so over the last several days. Son also reported frequent coughing productive with thick sputum. There is been no vomiting no diarrhea. He reports she "felt warm". No complaints of dysuria hematuria frequency or urgency. No complaints of headache dizziness syncope or near-syncope.  Assessment & Plan:   Principal Problem:   Acute encephalopathy Active Problems:   Dementia   Hypercholesteremia   Essential hypertension   PNA (pneumonia)   CKD (chronic kidney disease)   Elevated troponin   Generalized weakness   Elevated transaminase level   Protein-calorie malnutrition, severe   Community-acquired pneumonia -Patient with mild leukocytosis, CXR with left base consolidation. hemodynamically stable and not hypoxic. -Flu PCR is negative, urine for Streptococcus antigen and blood cultures done. -On Zithromax  and Rocephin, continued. -Continue supportive management with bronchodilators, mucolytics, antitussives and oxygen as needed. -Can likely be discharged on Augmentin in a.m.  Acute encephalopathy in the setting of moderate dementia  -Likely related to pneumonia and dehydration.  -Obtain folate B-12 and RPR, If no improvement consider CT of the head.  -Unclear to me what her baseline, appears comfortable oriented to self only.  Elevated troponin -Initial troponin 0.22. No chest pain reported. EKG without acute changes. Likely related to demand -Slightly elevated troponin at 0.22 and 0.21, patient denies any chest pain, EKG without ischemic findings. -Elevated troponin could be secondary to elevated creatinine.  Generalized weakness/decreased oral intake -Likely secondary to #1 in the setting of what appears to be failure to thrive. -Physical therapy recommended SNF, she has W consulted, likely to discharge in a.m.  Chronic kidney disease stage III -Creatinine 1.5. Chart review indicates this is close to baseline. Urine quite concentrated -Gentle IV fluids -Hold nephrotoxins -Monitor urine output  Transaminitis -Son reports a history of "liver disease". No history of EtOH abuse no history of hepatitis. Alkaline phosphatase ALT AST elevated since April of this year. Total bilirubin 1.3. Ammonia level 15. Etiology unclear. Home meds include statin -Monitor  Hypertension -Blood pressure controlled in the emergency department. Home medications include amlodipine. -We'll hold amlodipine for today -Resume amlodipine tomorrow -Monitor closely adjust medications as indicated  Severe protein calorie malnutrition -Underweight context of chronic illness, ensure and Magic cup   DVT prophylaxis: Subcutaneous heparin Code Status: Partial Code Family Communication:  Disposition Plan: From home to discharged to SNF in a.m. Diet: Diet Heart Room service appropriate? Yes; Fluid consistency:  Thin  Consultants:   None  Procedures:   None  Antimicrobials:   Azithromycin and Rocephin  Objective: Vitals:   03/04/16 1431  03/04/16 2052 03/04/16 2155 03/05/16 0534  BP: (!) 182/73  (!) 151/77 (!) 153/79  Pulse: 77 76 88 69  Resp: 16 16 20 18   Temp: 97.9 F (36.6 C)  98.7 F (37.1 C) 98 F (36.7 C)  TempSrc: Oral  Oral   SpO2: 95% 96% 100% 95%  Weight:      Height:       No intake or output data in the 24 hours ending 03/05/16 1125 Filed Weights   03/02/16 0527 03/02/16 0810  Weight: 56.7 kg (125 lb) 44.5 kg (98 lb 3.2 oz)    Examination: General exam: Appears calm and comfortable  Respiratory system: Clear to auscultation. Respiratory effort normal. Cardiovascular system: S1 & S2 heard, RRR. No JVD, murmurs, rubs, gallops or clicks. No pedal edema. Gastrointestinal system: Abdomen is nondistended, soft and nontender. No organomegaly or masses felt. Normal bowel sounds heard. Central nervous system: Alert and oriented. No focal neurological deficits. Extremities: Symmetric 5 x 5 power. Skin: No rashes, lesions or ulcers Psychiatry: Judgement and insight appear normal. Mood & affect appropriate.   Data Reviewed: I have personally reviewed following labs and imaging studies  CBC:  Recent Labs Lab 03/02/16 0532 03/02/16 0918 03/03/16 0441  WBC 10.6*  --  9.0  HGB 12.8  --  11.1*  HCT 37.0 38.3 34.5*  MCV 91.1  --  92.5  PLT 273  --  99991111   Basic Metabolic Panel:  Recent Labs Lab 03/02/16 0532 03/02/16 1155 03/03/16 0441  NA 139  --  139  K 4.3  --  4.1  CL 109  --  110  CO2 17*  --  18*  GLUCOSE 118*  --  91  BUN 64*  --  58*  CREATININE 1.52*  1.58*  --  1.18*  CALCIUM 9.9  --  9.5  MG  --  2.5*  --   PHOS  --  3.6  --    GFR: Estimated Creatinine Clearance: 24.5 mL/min (by C-G formula based on SCr of 1.18 mg/dL (H)). Liver Function Tests:  Recent Labs Lab 03/02/16 0532 03/03/16 0441  AST 206* 169*  ALT 196* 169*  ALKPHOS 77 70   BILITOT 1.3* 0.6  PROT 6.7 6.0*  ALBUMIN 2.7* 2.3*   No results for input(s): LIPASE, AMYLASE in the last 168 hours.  Recent Labs Lab 03/02/16 0532  AMMONIA 15   Coagulation Profile:  Recent Labs Lab 03/02/16 1155  INR 1.20   Cardiac Enzymes:  Recent Labs Lab 03/02/16 0532 03/02/16 1155  TROPONINI 0.22* 0.21*   BNP (last 3 results) No results for input(s): PROBNP in the last 8760 hours. HbA1C: No results for input(s): HGBA1C in the last 72 hours. CBG:  Recent Labs Lab 03/02/16 0536  GLUCAP 116*   Lipid Profile: No results for input(s): CHOL, HDL, LDLCALC, TRIG, CHOLHDL, LDLDIRECT in the last 72 hours. Thyroid Function Tests: No results for input(s): TSH, T4TOTAL, FREET4, T3FREE, THYROIDAB in the last 72 hours. Anemia Panel:  Recent Labs  03/02/16 1155  VITAMINB12 766   Urine analysis:    Component Value Date/Time   COLORURINE YELLOW 03/02/2016 0605   APPEARANCEUR HAZY (A) 03/02/2016 0605   LABSPEC 1.020 03/02/2016 0605   PHURINE 5.0 03/02/2016 0605   GLUCOSEU NEGATIVE 03/02/2016 0605   HGBUR LARGE (A) 03/02/2016 0605   BILIRUBINUR NEGATIVE 03/02/2016 0605   KETONESUR NEGATIVE 03/02/2016 0605   PROTEINUR 30 (A) 03/02/2016 0605   NITRITE NEGATIVE 03/02/2016 0605   LEUKOCYTESUR NEGATIVE  03/02/2016 0605   Sepsis Labs: @LABRCNTIP (procalcitonin:4,lacticidven:4)  ) Recent Results (from the past 240 hour(s))  Urine culture     Status: None   Collection Time: 03/02/16  6:05 AM  Result Value Ref Range Status   Specimen Description URINE, CATHETERIZED  Final   Special Requests NONE  Final   Culture NO GROWTH  Final   Report Status 03/03/2016 FINAL  Final  Culture, blood (routine x 2) Call MD if unable to obtain prior to antibiotics being given     Status: None (Preliminary result)   Collection Time: 03/02/16  7:35 AM  Result Value Ref Range Status   Specimen Description BLOOD LEFT HAND  Final   Special Requests BOTTLES DRAWN AEROBIC AND ANAEROBIC  5CC  Final   Culture NO GROWTH 2 DAYS  Final   Report Status PENDING  Incomplete  Culture, blood (routine x 2) Call MD if unable to obtain prior to antibiotics being given     Status: None (Preliminary result)   Collection Time: 03/02/16  7:40 AM  Result Value Ref Range Status   Specimen Description BLOOD RIGHT HAND  Final   Special Requests BOTTLES DRAWN AEROBIC AND ANAEROBIC 5CC  Final   Culture NO GROWTH 2 DAYS  Final   Report Status PENDING  Incomplete     Invalid input(s): PROCALCITONIN, [place]   Radiology Studies: No results found.      Scheduled Meds: . amLODipine  2.5 mg Oral Daily  . azithromycin  500 mg Oral Q24H  . cefTRIAXone (ROCEPHIN)  IV  1 g Intravenous Q24H  . feeding supplement (ENSURE ENLIVE)  237 mL Oral BID BM  . heparin  5,000 Units Subcutaneous Q8H  . ipratropium-albuterol  3 mL Nebulization BID   Continuous Infusions:   LOS: 2 days    Time spent: 35 minutes    Cornelious Diven A, MD [facility] Pager [phone]  If 7PM-7AM, please contact night-coverage [email or web address] Password [facility] 03/05/2016, 11:25 AM

## 2016-03-05 NOTE — Progress Notes (Signed)
Physical Therapy Treatment Patient Details Name: Jamie Lester MRN: [id number] DOB: Jan 29, 1929 Today's Date: 03/05/2016    History of Present Illness 82 y.o. female with a Past Medical History significant for HTN, dementia, CKD who presents with AMS and weakness.    PT Comments    Pt performed increased mobility but required significant assistance.  Pt with decreased strength and poor muscle tone.  Pt has increased difficulty maintaining posture in seated and standing positions.    Follow Up Recommendations  SNF;Supervision/Assistance - 24 hour     Equipment Recommendations  None recommended by PT    Recommendations for Other Services       Precautions / Restrictions Precautions Precautions: Fall Restrictions Weight Bearing Restrictions: No    Mobility  Bed Mobility Overal bed mobility: Needs Assistance Bed Mobility: Supine to Sit     Supine to sit: Max assist;HOB elevated Sit to supine: Max assist;HOB elevated   General bed mobility comments: Cues for advancement of B LEs to edge of bed.  Significant assistance for trunk elevation with posterior lean.  Pt with poor sitting balance and tolerance to maintain sitting at edge of bed.    Transfers Overall transfer level: Needs assistance Equipment used: Rolling walker (2 wheeled) Transfers: Sit to/from Stand           General transfer comment: Pt performed sit to stand with mod assist +1, Pt then required max assist to total assist to maintain standing.  Pt starts to sink into trunk flexion and knee flexion during steps to chair.    Ambulation/Gait Ambulation/Gait assistance:  (steps to chair with total assist.  )               Stairs            Wheelchair Mobility    Modified Rankin (Stroke Patients Only)       Balance Overall balance assessment: Needs assistance   Sitting balance-Leahy Scale: Poor       Standing balance-Leahy Scale: Zero                      Cognition  Arousal/Alertness: Awake/alert Behavior During Therapy: WFL for tasks assessed/performed Overall Cognitive Status: No family/caregiver present to determine baseline cognitive functioning                 General Comments: Pt able to reports [place] and that it is a better neighborhood.  Pt also able to report that she is in the hospital but does not know why.      Exercises      General Comments        Pertinent Vitals/Pain Pain Assessment: No/denies pain Faces Pain Scale: No hurt Pain Intervention(s): Limited activity within patient's tolerance    Home Living                      Prior Function            PT Goals (current goals can now be found in the care plan section) Acute Rehab PT Goals PT Goal Formulation: Patient unable to participate in goal setting Potential to Achieve Goals: Fair Progress towards PT goals: Progressing toward goals    Frequency    Min 3X/week      PT Plan Current plan remains appropriate    Co-evaluation             End of Session Equipment Utilized During Treatment: Gait belt Activity  Tolerance: Patient limited by fatigue Patient left: in bed;with bed alarm set;with call bell/phone within reach     Time: [id number] PT Time Calculation (min) (ACUTE ONLY): 18 min  Charges:  $Therapeutic Activity: 8-22 mins                    G Codes:      Cristela Blue 2016/03/20, 10:27 AM  Governor Rooks, PTA pager [phone]

## 2016-03-06 ENCOUNTER — Encounter (HOSPITAL_COMMUNITY): Payer: Self-pay | Admitting: General Practice

## 2016-03-06 DIAGNOSIS — M25551 Pain in right hip: Secondary | ICD-10-CM | POA: Diagnosis not present

## 2016-03-06 DIAGNOSIS — G9341 Metabolic encephalopathy: Secondary | ICD-10-CM | POA: Diagnosis present

## 2016-03-06 DIAGNOSIS — G9340 Encephalopathy, unspecified: Secondary | ICD-10-CM | POA: Diagnosis not present

## 2016-03-06 DIAGNOSIS — E43 Unspecified severe protein-calorie malnutrition: Secondary | ICD-10-CM | POA: Diagnosis present

## 2016-03-06 DIAGNOSIS — R627 Adult failure to thrive: Secondary | ICD-10-CM | POA: Diagnosis not present

## 2016-03-06 DIAGNOSIS — Z515 Encounter for palliative care: Secondary | ICD-10-CM | POA: Diagnosis not present

## 2016-03-06 DIAGNOSIS — M79604 Pain in right leg: Secondary | ICD-10-CM | POA: Diagnosis present

## 2016-03-06 DIAGNOSIS — N189 Chronic kidney disease, unspecified: Secondary | ICD-10-CM | POA: Diagnosis not present

## 2016-03-06 DIAGNOSIS — E860 Dehydration: Secondary | ICD-10-CM | POA: Diagnosis present

## 2016-03-06 DIAGNOSIS — Z7189 Other specified counseling: Secondary | ICD-10-CM | POA: Diagnosis not present

## 2016-03-06 DIAGNOSIS — E46 Unspecified protein-calorie malnutrition: Secondary | ICD-10-CM | POA: Diagnosis not present

## 2016-03-06 DIAGNOSIS — R532 Functional quadriplegia: Secondary | ICD-10-CM | POA: Diagnosis present

## 2016-03-06 DIAGNOSIS — R0902 Hypoxemia: Secondary | ICD-10-CM | POA: Diagnosis not present

## 2016-03-06 DIAGNOSIS — E785 Hyperlipidemia, unspecified: Secondary | ICD-10-CM | POA: Diagnosis not present

## 2016-03-06 DIAGNOSIS — Z681 Body mass index (BMI) 19 or less, adult: Secondary | ICD-10-CM | POA: Diagnosis not present

## 2016-03-06 DIAGNOSIS — J189 Pneumonia, unspecified organism: Secondary | ICD-10-CM | POA: Diagnosis not present

## 2016-03-06 DIAGNOSIS — N183 Chronic kidney disease, stage 3 (moderate): Secondary | ICD-10-CM | POA: Diagnosis not present

## 2016-03-06 DIAGNOSIS — M25571 Pain in right ankle and joints of right foot: Secondary | ICD-10-CM | POA: Diagnosis not present

## 2016-03-06 DIAGNOSIS — I10 Essential (primary) hypertension: Secondary | ICD-10-CM | POA: Diagnosis not present

## 2016-03-06 DIAGNOSIS — E870 Hyperosmolality and hypernatremia: Secondary | ICD-10-CM | POA: Diagnosis not present

## 2016-03-06 DIAGNOSIS — M179 Osteoarthritis of knee, unspecified: Secondary | ICD-10-CM | POA: Diagnosis not present

## 2016-03-06 DIAGNOSIS — R1310 Dysphagia, unspecified: Secondary | ICD-10-CM | POA: Diagnosis not present

## 2016-03-06 DIAGNOSIS — Z66 Do not resuscitate: Secondary | ICD-10-CM | POA: Diagnosis present

## 2016-03-06 DIAGNOSIS — L89151 Pressure ulcer of sacral region, stage 1: Secondary | ICD-10-CM | POA: Diagnosis present

## 2016-03-06 DIAGNOSIS — N179 Acute kidney failure, unspecified: Secondary | ICD-10-CM | POA: Diagnosis present

## 2016-03-06 DIAGNOSIS — K769 Liver disease, unspecified: Secondary | ICD-10-CM | POA: Diagnosis not present

## 2016-03-06 DIAGNOSIS — J181 Lobar pneumonia, unspecified organism: Secondary | ICD-10-CM | POA: Diagnosis not present

## 2016-03-06 DIAGNOSIS — R531 Weakness: Secondary | ICD-10-CM | POA: Diagnosis not present

## 2016-03-06 DIAGNOSIS — Z7401 Bed confinement status: Secondary | ICD-10-CM | POA: Diagnosis not present

## 2016-03-06 DIAGNOSIS — R4182 Altered mental status, unspecified: Secondary | ICD-10-CM | POA: Diagnosis not present

## 2016-03-06 DIAGNOSIS — F0390 Unspecified dementia without behavioral disturbance: Secondary | ICD-10-CM | POA: Diagnosis present

## 2016-03-06 DIAGNOSIS — M6281 Muscle weakness (generalized): Secondary | ICD-10-CM | POA: Diagnosis not present

## 2016-03-06 DIAGNOSIS — R918 Other nonspecific abnormal finding of lung field: Secondary | ICD-10-CM | POA: Diagnosis not present

## 2016-03-06 DIAGNOSIS — Y95 Nosocomial condition: Secondary | ICD-10-CM | POA: Diagnosis present

## 2016-03-06 DIAGNOSIS — R1311 Dysphagia, oral phase: Secondary | ICD-10-CM | POA: Diagnosis not present

## 2016-03-06 MED ORDER — TRAZODONE HCL 50 MG PO TABS: 25.0000 mg | ORAL_TABLET | Freq: Every evening | ORAL | Status: AC | PRN

## 2016-03-06 MED ORDER — SENNOSIDES-DOCUSATE SODIUM 8.6-50 MG PO TABS: 1.0000 | ORAL_TABLET | Freq: Two times a day (BID) | ORAL | Status: AC

## 2016-03-06 MED ORDER — ENSURE ENLIVE PO LIQD
237.0000 mL | Freq: Four times a day (QID) | ORAL | Status: DC
  Administered 2016-03-06: 237 mL via ORAL

## 2016-03-06 MED ORDER — ENSURE ENLIVE PO LIQD: 237.0000 mL | Freq: Two times a day (BID) | ORAL | 12 refills | Status: AC

## 2016-03-06 MED ORDER — AMOXICILLIN-POT CLAVULANATE 875-125 MG PO TABS: 1.0000 | ORAL_TABLET | Freq: Two times a day (BID) | ORAL | Status: AC

## 2016-03-06 MED ORDER — ADULT MULTIVITAMIN W/MINERALS CH
1.0000 | ORAL_TABLET | Freq: Every day | ORAL | Status: DC
  Administered 2016-03-06: 1 via ORAL
  Filled 2016-03-06: qty 1

## 2016-03-06 MED ORDER — ALBUTEROL SULFATE (2.5 MG/3ML) 0.083% IN NEBU: 2.5000 mg | INHALATION_SOLUTION | Freq: Four times a day (QID) | RESPIRATORY_TRACT | 12 refills | Status: AC | PRN

## 2016-03-06 MED ORDER — AMOXICILLIN-POT CLAVULANATE 875-125 MG PO TABS: 1.0000 | ORAL_TABLET | Freq: Two times a day (BID) | ORAL | Status: DC

## 2016-03-06 NOTE — Progress Notes (Addendum)
Nutrition Follow-up  DOCUMENTATION CODES:   Underweight, Severe malnutrition in context of chronic illness  INTERVENTION:   -Increase Ensure Enlive po to QID, each supplement provides 350 kcal and 20 grams of protein -MVI daily  NUTRITION DIAGNOSIS:   Malnutrition related to chronic illness, acute illness (Dementia and exacerbated by acute PNA episode) as evidenced by severe depletion of muscle mass, severe depletion of body fat, energy intake < or equal to 50% for > or equal to 5 days.  Ongoing  GOAL:   Patient will meet greater than or equal to 90% of their needs  Unmet  MONITOR:   PO intake, Supplement acceptance, Diet advancement, Labs, I & O's  REASON FOR ASSESSMENT:   Consult Poor PO  ASSESSMENT:   Jamie Lester is a 82 y.o. female with medical history significant dementia, hypertension, hyperlipidemia chronic kidney disease, GI bleed, liver disease presents to emergency Department chief complaint of altered mental status generalized weakness. Initial evaluation reveals a chest x-ray concerning for pneumonia sitting liver function tests elevated troponin leukocytosis.  RD received another consult for poor PO intake.   Pt underwent BSE on 03/04/16; regular diet with thin liquids was recommended.  Spoke with pt son at bedside, who reports he is trying to decide to discharge pt home or to SNF to regain strength. He shares that pt's appetite is unchanged. Pt eats nothing off of her meal trays, however, does drink Ensure. Pt son estimates pt consumes 50% of Ensure when offered. Pt son is amenable to increasing frequency of supplements to optimize nutritional adequacy. Pt son reports he uses Ensure original at home; discussed using a higher density supplement, such as Ensure Enlive or Ensure Plus.   Pt son is optimistic that intake will improve now that pneumonia is resolved.   Labs reviewed.   Diet Order:  Diet Heart Room service appropriate? Yes; Fluid  consistency: Thin  Skin:  Reviewed, no issues  Last BM:  03/01/16  Height:   Ht Readings from Last 1 Encounters:  03/02/16 5\' 2"  (1.575 m)    Weight:   Wt Readings from Last 1 Encounters:  03/02/16 98 lb 3.2 oz (44.5 kg)    Ideal Body Weight:  50 kg  BMI:  Body mass index is 17.96 kg/m.  Estimated Nutritional Needs:   Kcal:  1350-1550 kcals (30-35 kcal/kg bw)  Protein:  62-71 g (1.4-1.6 g/kg bw)  Fluid:  >1.1 L (25 ml/kg bw)  EDUCATION NEEDS:   No education needs identified at this time  Jamie Lester A. Jimmye Norman, RD, LDN, CDE Pager: [phone] After hours Pager: [phone]

## 2016-03-06 NOTE — Clinical Social Work Placement (Signed)
   CLINICAL SOCIAL WORK PLACEMENT  NOTE  Date:  03/06/2016  Patient Details  Name: MAGGY RONES MRN: [id number] Date of Birth: 1928/05/16  Clinical Social Work is seeking post-discharge placement for this patient at the [place] level of care (*CSW will initial, date and re-position this form in  chart as items are completed):      Patient/family provided with [place] Work Department's list of facilities offering this level of care within the geographic area requested by the patient (or if unable, by the patient's family).      Patient/family informed of their freedom to choose among providers that offer the needed level of care, that participate in Medicare, Medicaid or managed care program needed by the patient, have an available bed and are willing to accept the patient.      Patient/family informed of 's ownership interest in [facility], as well as of the fact that they are under no obligation to receive care at these facilities.  PASRR submitted to EDS on 03/04/16     PASRR number received on 03/04/16     Existing PASRR number confirmed on       FL2 transmitted to all facilities in geographic area requested by pt/family on 03/04/16     FL2 transmitted to all facilities within larger geographic area on       Patient informed that his/her managed care company has contracts with or will negotiate with certain facilities, including the following:        Yes   Patient/family informed of bed offers received.  Patient chooses bed at [facility]     Physician recommends and patient chooses bed at      Patient to be transferred to [facility] on 03/06/16.  Patient to be transferred to facility by ptar     Patient family notified on 03/06/16 of transfer.  Name of family member notified:  alonzo     PHYSICIAN Please sign FL2     Additional Comment:     _______________________________________________ Jorge Ny, LCSW 03/06/2016, 4:16 PM

## 2016-03-06 NOTE — Progress Notes (Signed)
Patient discharged to SNF via [facility]. IVs removed and report called to [place] at [phone]

## 2016-03-06 NOTE — Discharge Instructions (Signed)
Community-Acquired Pneumonia, Adult °Introduction °Pneumonia is an infection of the lungs. One type of pneumonia can happen while a person is in a hospital. A different type can happen when a person is not in a hospital (community-acquired pneumonia). It is easy for this kind to spread from person to person. It can spread to you if you breathe near an infected person who coughs or sneezes. Some symptoms include: °· A dry cough. °· A wet (productive) cough. °· Fever. °· Sweating. °· Chest pain. °Follow these instructions at home: °· Take over-the-counter and prescription medicines only as told by your doctor. °¨ Only take cough medicine if you are losing sleep. °¨ If you were prescribed an antibiotic medicine, take it as told by your doctor. Do not stop taking the antibiotic even if you start to feel better. °· Sleep with your head and neck raised (elevated). You can do this by putting a few pillows under your head, or you can sleep in a recliner. °· Do not use tobacco products. These include cigarettes, chewing tobacco, and e-cigarettes. If you need help quitting, ask your doctor. °· Drink enough water to keep your pee (urine) clear or pale yellow. °A shot (vaccine) can help prevent pneumonia. Shots are often suggested for: °· People older than 80 years of age. °· People older than 80 years of age: °¨ Who are having cancer treatment. °¨ Who have long-term (chronic) lung disease. °¨ Who have problems with their body's defense system (immune system). °You may also prevent pneumonia if you take these actions: °· Get the flu (influenza) shot every year. °· Go to the dentist as often as told. °· Wash your hands often. If soap and water are not available, use hand sanitizer. °Contact a doctor if: °· You have a fever. °· You lose sleep because your cough medicine does not help. °Get help right away if: °· You are short of breath and it gets worse. °· You have more chest pain. °· Your sickness gets worse. This is very  serious if: °¨ You are an older adult. °¨ Your body's defense system is weak. °· You cough up blood. °This information is not intended to replace advice given to you by your health care provider. Make sure you discuss any questions you have with your health care provider. °Document Released: 08/28/2007 Document Revised: 08/17/2015 Document Reviewed: 07/06/2014 °© 2017 [employer] ° °

## 2016-03-06 NOTE — Discharge Summary (Addendum)
[facility]  Physician Discharge Summary   Patient ID: Jamie Lester MRN: [id number] DOB/AGE: Nov 29, 1928 82 y.o.  Admit date: 03/02/2016 Discharge date: 03/06/2016  PCP: Cammy Copa, MD  DISCHARGE DIAGNOSES:  Principal Problem:   Acute encephalopathy Active Problems:   Dementia   Hypercholesteremia   Essential hypertension   PNA (pneumonia)   CKD (chronic kidney disease)   Elevated troponin   Generalized weakness   Elevated transaminase level   Protein-calorie malnutrition, severe   RECOMMENDATIONS FOR OUTPATIENT FOLLOW UP: 1. Check CBC and platelet metabolic panel including LFTs on Friday 12/15. 2. Resume Lipitor if LFTs are normal. Otherwise, continue to hold. 3. Consider palliative medicine input at SNF for goals of care. 4. Needs repeat chest x-ray in 3-4 weeks   DISCHARGE CONDITION: fair  Diet recommendation: Heart healthy  Filed Weights   03/02/16 0527 03/02/16 0810  Weight: 56.7 kg (125 lb) 44.5 kg (98 lb 3.2 oz)    INITIAL HISTORY: 82 y.o.femalewith medical history significant dementia, hypertension, hyperlipidemia chronic kidney disease, GI bleed, liver disease presents to emergency Department chief complaint of altered mental status generalized weakness. Initial evaluation revealed a chest x-ray concerning for pneumonia along with abnormal liver function tests, elevated troponin and leukocytosis.   HOSPITAL COURSE:   Community-acquired pneumonia -Patient presented with mild leukocytosis, CXR with left base consolidation. hemodynamically stable and not hypoxic. -Flu PCR is negative, urine for Streptococcus antigen was negative. No growth noted on blood cultures either -Patient was initially placed on ceftriaxone and azithromycin. She was given nebulizer treatments. She started improving. Will change to Augmentin this morning, for 5 more days.Faythe Ghee for discharge to SNF today.   Acute metabolic encephalopathy in the setting of  moderatedementia  -Likely related to pneumonia and dehydration.  Mental status appears to be at baseline now. According to the son, patient has not been getting up and walking for the past few months. Her physical status has declined.  Elevated troponin -Initial troponin 0.22. No chest pain reported. EKG without acute changes. Likely related to demand. no need for further workup.  Generalized weakness/decreased oral intake -Likely secondary to #1 in the setting of what appears to be failure to thrive. -Physical therapy recommended SNF  Chronic kidney disease stage III -Creatinine 1.5. Chart review indicates this is close to baseline. Urine quite concentrated She was given gentle IV hydration. Creatinine came down to 1.18.  Transaminitis -Son reports a history of "liver disease". No history of EtOH abuse no history of hepatitis. Alkaline phosphatase ALT AST elevated since April of this year. Patient is noted to be on a statin. LFTs did start trending down. Could've been elevated due to acute illness. We will continue to recommend holding Lipitor. Recheck LFTs in a few days. May resume Lipitor if LFTs have come down to normal.  Essential Hypertension Continue home medications  Severe protein calorie malnutrition -Underweight context of chronic illness. Continue ensure  Overall, stable. Okay for discharge to SNF today.    PERTINENT LABS:  The results of significant diagnostics from this hospitalization (including imaging, microbiology, ancillary and laboratory) are listed below for reference.    Microbiology: Recent Results (from the past 240 hour(s))  Urine culture     Status: None   Collection Time: 03/02/16  6:05 AM  Result Value Ref Range Status   Specimen Description URINE, CATHETERIZED  Final   Special Requests NONE  Final   Culture NO GROWTH  Final   Report Status 03/03/2016 FINAL  Final  Culture, blood (  routine x 2) Call MD if unable to obtain prior to antibiotics  being given     Status: None (Preliminary result)   Collection Time: 03/02/16  7:35 AM  Result Value Ref Range Status   Specimen Description BLOOD LEFT HAND  Final   Special Requests BOTTLES DRAWN AEROBIC AND ANAEROBIC 5CC  Final   Culture NO GROWTH 3 DAYS  Final   Report Status PENDING  Incomplete  Culture, blood (routine x 2) Call MD if unable to obtain prior to antibiotics being given     Status: None (Preliminary result)   Collection Time: 03/02/16  7:40 AM  Result Value Ref Range Status   Specimen Description BLOOD RIGHT HAND  Final   Special Requests BOTTLES DRAWN AEROBIC AND ANAEROBIC 5CC  Final   Culture NO GROWTH 3 DAYS  Final   Report Status PENDING  Incomplete     Labs: Basic Metabolic Panel:  Recent Labs Lab 03/02/16 0532 03/02/16 1155 03/03/16 0441  NA 139  --  139  K 4.3  --  4.1  CL 109  --  110  CO2 17*  --  18*  GLUCOSE 118*  --  91  BUN 64*  --  58*  CREATININE 1.52*  1.58*  --  1.18*  CALCIUM 9.9  --  9.5  MG  --  2.5*  --   PHOS  --  3.6  --    Liver Function Tests:  Recent Labs Lab 03/02/16 0532 03/03/16 0441  AST 206* 169*  ALT 196* 169*  ALKPHOS 77 70  BILITOT 1.3* 0.6  PROT 6.7 6.0*  ALBUMIN 2.7* 2.3*    Recent Labs Lab 03/02/16 0532  AMMONIA 15   CBC:  Recent Labs Lab 03/02/16 0532 03/02/16 0918 03/03/16 0441  WBC 10.6*  --  9.0  HGB 12.8  --  11.1*  HCT 37.0 38.3 34.5*  MCV 91.1  --  92.5  PLT 273  --  271   Cardiac Enzymes:  Recent Labs Lab 03/02/16 0532 03/02/16 1155  TROPONINI 0.22* 0.21*    CBG:  Recent Labs Lab 03/02/16 0536  GLUCAP 116*     IMAGING STUDIES Ct Head Wo Contrast  Result Date: 03/02/2016 CLINICAL DATA:  Increased lethargy and weakness for 2 days. EXAM: CT HEAD WITHOUT CONTRAST TECHNIQUE: Contiguous axial images were obtained from the base of the skull through the vertex without intravenous contrast. COMPARISON:  07/14/2015 FINDINGS: Brain: Stable marked brain atrophy and chronic white  matter microvascular ischemic changes throughout the cerebral hemispheres. No acute intracranial hemorrhage, definite new infarction, mass lesion, midline shift, herniation, hydrocephalus, or extra-axial fluid collection. No focal mass effect or edema. Cisterns are patent. Cerebellar atrophy as well. Vascular: No hyperdense vessel or unexpected calcification. Skull: Normal. Negative for fracture or focal lesion. Sinuses/Orbits: No acute finding. Other: None. IMPRESSION: Stable chronic atrophy and microvascular white matter ischemic changes. No interval change or acute process by noncontrast CT. Electronically Signed   By: Jerilynn Mages.  Shick M.D.   On: 03/02/2016 12:30   Dg Chest Portable 1 View  Result Date: 03/02/2016 CLINICAL DATA:  Cough and lethargy for 2 days. EXAM: PORTABLE CHEST 1 VIEW COMPARISON:  None. FINDINGS: There is consolidation in the left base. There is moderate cardiomegaly. No large effusion. Right lung is clear. Pulmonary vasculature is normal. Hilar and mediastinal contours are unremarkable. IMPRESSION: Left base consolidation, suspicious for pneumonia. Followup PA and lateral chest X-ray is recommended in 3-4 weeks following trial of antibiotic therapy to  ensure resolution and exclude underlying malignancy. Electronically Signed   By: Andreas Newport M.D.   On: 03/02/2016 06:29    DISCHARGE EXAMINATION: Vitals:   03/05/16 2257 03/06/16 0532 03/06/16 1026 03/06/16 1222  BP: (!) 156/77 (!) 148/85  (!) 158/83  Pulse: 73 73    Resp: 18 18    Temp: 98.7 F (37.1 C) 97.9 F (36.6 C)    TempSrc: Oral Oral    SpO2: 97% 90% 98%   Weight:      Height:       General appearance: alert, distracted and no distress Resp: clear to auscultation bilaterally Cardio: regular rate and rhythm, S1, S2 normal, no murmur, click, rub or gallop GI: soft, non-tender; bowel sounds normal; no masses,  no organomegaly Extremities: extremities normal, atraumatic, no cyanosis or edema No obvious focal  neurological deficits. Does not follow commands all the time.  DISPOSITION: SNF  Discharge Instructions    Call MD for:  extreme fatigue    Complete by:  As directed    Call MD for:  persistant dizziness or light-headedness    Complete by:  As directed    Call MD for:  persistant nausea and vomiting    Complete by:  As directed    Call MD for:  redness, tenderness, or signs of infection (pain, swelling, redness, odor or green/yellow discharge around incision site)    Complete by:  As directed    Call MD for:  temperature >100.4    Complete by:  As directed    Discharge instructions    Complete by:  As directed    See instructions on discharge summary  You were cared for by a hospitalist during your hospital stay. If you have any questions about your discharge medications or the care you received while you were in the hospital after you are discharged, you can call the unit and asked to speak with the hospitalist on call if the hospitalist that took care of you is not available. Once you are discharged, your primary care physician will handle any further medical issues. Please note that NO REFILLS for any discharge medications will be authorized once you are discharged, as it is imperative that you return to your primary care physician (or establish a relationship with a primary care physician if you do not have one) for your aftercare needs so that they can reassess your need for medications and monitor your lab values. If you do not have a primary care physician, you can call [phone] for a physician referral.   Increase activity slowly    Complete by:  As directed       ALLERGIES: No Known Allergies    Current Discharge Medication List    START taking these medications   Details  albuterol (PROVENTIL) (2.5 MG/3ML) 0.083% nebulizer solution Take 3 mLs (2.5 mg total) by nebulization every 6 (six) hours as needed for wheezing or shortness of breath. Qty: 75 mL, Refills: 12      amoxicillin-clavulanate (AUGMENTIN) 875-125 MG tablet Take 1 tablet by mouth every 12 (twelve) hours. For 5 more days    feeding supplement, ENSURE ENLIVE, (ENSURE ENLIVE) LIQD Take 237 mLs by mouth 2 (two) times daily between meals. Qty: 237 mL, Refills: 12    senna-docusate (SENOKOT-S) 8.6-50 MG tablet Take 1 tablet by mouth 2 (two) times daily.    traZODone (DESYREL) 50 MG tablet Take 0.5 tablets (25 mg total) by mouth at bedtime as needed for sleep.  CONTINUE these medications which have NOT CHANGED   Details  amLODipine (NORVASC) 2.5 MG tablet Take 2.5 mg by mouth daily after breakfast.  Refills: 6    aspirin EC 81 MG tablet Take 81 mg by mouth daily after breakfast.    Multiple Vitamin (MULTIVITAMIN WITH MINERALS) TABS tablet Take 1 tablet by mouth daily after breakfast.      STOP taking these medications     atorvastatin (LIPITOR) 40 MG tablet         Follow-up Information    Cammy Copa, MD. Schedule an appointment as soon as possible for a visit in 1 week(s).   Specialty:  Family Medicine Contact information: [address], [address] [place] [address] [phone]           TOTAL DISCHARGE TIME: 21 minutes  [facility] Pager [phone]  03/06/2016, 1:09 PM

## 2016-03-07 DIAGNOSIS — F0390 Unspecified dementia without behavioral disturbance: Secondary | ICD-10-CM | POA: Diagnosis not present

## 2016-03-07 DIAGNOSIS — N183 Chronic kidney disease, stage 3 (moderate): Secondary | ICD-10-CM | POA: Diagnosis not present

## 2016-03-07 DIAGNOSIS — G9340 Encephalopathy, unspecified: Secondary | ICD-10-CM | POA: Diagnosis not present

## 2016-03-07 DIAGNOSIS — J189 Pneumonia, unspecified organism: Secondary | ICD-10-CM | POA: Diagnosis not present

## 2016-03-07 LAB — CULTURE, BLOOD (ROUTINE X 2)
CULTURE: NO GROWTH
CULTURE: NO GROWTH

## 2016-03-08 DIAGNOSIS — F0390 Unspecified dementia without behavioral disturbance: Secondary | ICD-10-CM | POA: Diagnosis not present

## 2016-03-08 DIAGNOSIS — E46 Unspecified protein-calorie malnutrition: Secondary | ICD-10-CM | POA: Diagnosis not present

## 2016-03-08 DIAGNOSIS — N183 Chronic kidney disease, stage 3 (moderate): Secondary | ICD-10-CM | POA: Diagnosis not present

## 2016-03-08 DIAGNOSIS — G9340 Encephalopathy, unspecified: Secondary | ICD-10-CM | POA: Diagnosis not present

## 2016-03-08 DIAGNOSIS — R1310 Dysphagia, unspecified: Secondary | ICD-10-CM | POA: Diagnosis not present

## 2016-03-08 DIAGNOSIS — J189 Pneumonia, unspecified organism: Secondary | ICD-10-CM | POA: Diagnosis not present

## 2016-03-08 DIAGNOSIS — R531 Weakness: Secondary | ICD-10-CM | POA: Diagnosis not present

## 2016-03-11 ENCOUNTER — Other Ambulatory Visit: Payer: Medicare Other

## 2016-03-12 DIAGNOSIS — J189 Pneumonia, unspecified organism: Secondary | ICD-10-CM | POA: Diagnosis not present

## 2016-03-12 DIAGNOSIS — G9340 Encephalopathy, unspecified: Secondary | ICD-10-CM | POA: Diagnosis not present

## 2016-03-12 DIAGNOSIS — F0390 Unspecified dementia without behavioral disturbance: Secondary | ICD-10-CM | POA: Diagnosis not present

## 2016-03-12 DIAGNOSIS — E43 Unspecified severe protein-calorie malnutrition: Secondary | ICD-10-CM | POA: Diagnosis not present

## 2016-03-13 DIAGNOSIS — E785 Hyperlipidemia, unspecified: Secondary | ICD-10-CM | POA: Diagnosis not present

## 2016-03-13 DIAGNOSIS — N183 Chronic kidney disease, stage 3 (moderate): Secondary | ICD-10-CM | POA: Diagnosis not present

## 2016-03-13 DIAGNOSIS — E43 Unspecified severe protein-calorie malnutrition: Secondary | ICD-10-CM | POA: Diagnosis not present

## 2016-03-13 DIAGNOSIS — J189 Pneumonia, unspecified organism: Secondary | ICD-10-CM | POA: Diagnosis not present

## 2016-03-15 DIAGNOSIS — E46 Unspecified protein-calorie malnutrition: Secondary | ICD-10-CM | POA: Diagnosis not present

## 2016-03-15 DIAGNOSIS — R1310 Dysphagia, unspecified: Secondary | ICD-10-CM | POA: Diagnosis not present

## 2016-03-15 DIAGNOSIS — F0390 Unspecified dementia without behavioral disturbance: Secondary | ICD-10-CM | POA: Diagnosis not present

## 2016-03-20 ENCOUNTER — Emergency Department (HOSPITAL_COMMUNITY): Payer: Medicare Other

## 2016-03-20 ENCOUNTER — Inpatient Hospital Stay (HOSPITAL_COMMUNITY)
Admission: EM | Admit: 2016-03-20 | Discharge: 2016-03-26 | DRG: 193 | Disposition: A | Discharge status: Hospice | Payer: Medicare Other | Attending: Internal Medicine | Admitting: Internal Medicine

## 2016-03-20 ENCOUNTER — Encounter (HOSPITAL_COMMUNITY): Payer: Self-pay | Admitting: Emergency Medicine

## 2016-03-20 DIAGNOSIS — Z681 Body mass index (BMI) 19 or less, adult: Secondary | ICD-10-CM

## 2016-03-20 DIAGNOSIS — R532 Functional quadriplegia: Secondary | ICD-10-CM | POA: Diagnosis present

## 2016-03-20 DIAGNOSIS — Z7401 Bed confinement status: Secondary | ICD-10-CM | POA: Diagnosis not present

## 2016-03-20 DIAGNOSIS — E43 Unspecified severe protein-calorie malnutrition: Secondary | ICD-10-CM | POA: Diagnosis present

## 2016-03-20 DIAGNOSIS — R4182 Altered mental status, unspecified: Secondary | ICD-10-CM | POA: Diagnosis not present

## 2016-03-20 DIAGNOSIS — E870 Hyperosmolality and hypernatremia: Secondary | ICD-10-CM

## 2016-03-20 DIAGNOSIS — I10 Essential (primary) hypertension: Secondary | ICD-10-CM | POA: Diagnosis present

## 2016-03-20 DIAGNOSIS — E860 Dehydration: Secondary | ICD-10-CM | POA: Diagnosis present

## 2016-03-20 DIAGNOSIS — F0390 Unspecified dementia without behavioral disturbance: Secondary | ICD-10-CM | POA: Diagnosis present

## 2016-03-20 DIAGNOSIS — G9341 Metabolic encephalopathy: Secondary | ICD-10-CM | POA: Diagnosis present

## 2016-03-20 DIAGNOSIS — N179 Acute kidney failure, unspecified: Secondary | ICD-10-CM | POA: Diagnosis not present

## 2016-03-20 DIAGNOSIS — Z7189 Other specified counseling: Secondary | ICD-10-CM | POA: Diagnosis not present

## 2016-03-20 DIAGNOSIS — Z66 Do not resuscitate: Secondary | ICD-10-CM | POA: Diagnosis present

## 2016-03-20 DIAGNOSIS — M179 Osteoarthritis of knee, unspecified: Secondary | ICD-10-CM | POA: Diagnosis not present

## 2016-03-20 DIAGNOSIS — M79604 Pain in right leg: Secondary | ICD-10-CM | POA: Diagnosis present

## 2016-03-20 DIAGNOSIS — R627 Adult failure to thrive: Secondary | ICD-10-CM | POA: Diagnosis not present

## 2016-03-20 DIAGNOSIS — J181 Lobar pneumonia, unspecified organism: Secondary | ICD-10-CM | POA: Diagnosis not present

## 2016-03-20 DIAGNOSIS — M25571 Pain in right ankle and joints of right foot: Secondary | ICD-10-CM | POA: Diagnosis not present

## 2016-03-20 DIAGNOSIS — L89151 Pressure ulcer of sacral region, stage 1: Secondary | ICD-10-CM | POA: Diagnosis not present

## 2016-03-20 DIAGNOSIS — R918 Other nonspecific abnormal finding of lung field: Secondary | ICD-10-CM | POA: Diagnosis not present

## 2016-03-20 DIAGNOSIS — Y95 Nosocomial condition: Secondary | ICD-10-CM | POA: Diagnosis present

## 2016-03-20 DIAGNOSIS — G9340 Encephalopathy, unspecified: Secondary | ICD-10-CM | POA: Diagnosis present

## 2016-03-20 DIAGNOSIS — J189 Pneumonia, unspecified organism: Secondary | ICD-10-CM | POA: Diagnosis present

## 2016-03-20 DIAGNOSIS — Z515 Encounter for palliative care: Secondary | ICD-10-CM

## 2016-03-20 DIAGNOSIS — M25551 Pain in right hip: Secondary | ICD-10-CM | POA: Diagnosis not present

## 2016-03-20 DIAGNOSIS — R0902 Hypoxemia: Secondary | ICD-10-CM | POA: Diagnosis not present

## 2016-03-20 LAB — COMPREHENSIVE METABOLIC PANEL
ALK PHOS: 80 U/L (ref 38–126)
ALT: 146 U/L — AB (ref 14–54)
ANION GAP: 12 (ref 5–15)
AST: 90 U/L — ABNORMAL HIGH (ref 15–41)
Albumin: 2.9 g/dL — ABNORMAL LOW (ref 3.5–5.0)
BILIRUBIN TOTAL: 0.4 mg/dL (ref 0.3–1.2)
BUN: 65 mg/dL — ABNORMAL HIGH (ref 6–20)
CALCIUM: 10.5 mg/dL — AB (ref 8.9–10.3)
CO2: 23 mmol/L (ref 22–32)
CREATININE: 1.29 mg/dL — AB (ref 0.44–1.00)
Chloride: 116 mmol/L — ABNORMAL HIGH (ref 101–111)
GFR calc Af Amer: 43 mL/min — ABNORMAL LOW (ref >60)
GFR, EST NON AFRICAN AMERICAN: 37 mL/min — AB (ref >60)
Glucose, Bld: 115 mg/dL — ABNORMAL HIGH (ref 65–99)
Potassium: 4.3 mmol/L (ref 3.5–5.1)
Sodium: 151 mmol/L — ABNORMAL HIGH (ref 135–145)
TOTAL PROTEIN: 7.2 g/dL (ref 6.5–8.1)

## 2016-03-20 LAB — URINALYSIS, ROUTINE W REFLEX MICROSCOPIC
Bilirubin Urine: NEGATIVE
GLUCOSE, UA: NEGATIVE mg/dL
KETONES UR: NEGATIVE mg/dL
NITRITE: NEGATIVE
PH: 5 (ref 5.0–8.0)
Protein, ur: 30 mg/dL — AB
Specific Gravity, Urine: 1.021 (ref 1.005–1.030)

## 2016-03-20 LAB — CBC WITH DIFFERENTIAL/PLATELET
BASOS ABS: 0 10*3/uL (ref 0.0–0.1)
BASOS PCT: 0 %
EOS ABS: 0 10*3/uL (ref 0.0–0.7)
Eosinophils Relative: 0 %
HCT: 35.8 % — ABNORMAL LOW (ref 36.0–46.0)
HEMOGLOBIN: 11.8 g/dL — AB (ref 12.0–15.0)
Lymphocytes Relative: 7 %
Lymphs Abs: 1.3 10*3/uL (ref 0.7–4.0)
MCH: 30.9 pg (ref 26.0–34.0)
MCHC: 33 g/dL (ref 30.0–36.0)
MCV: 93.7 fL (ref 78.0–100.0)
MONOS PCT: 3 %
Monocytes Absolute: 0.6 10*3/uL (ref 0.1–1.0)
NEUTROS PCT: 90 %
Neutro Abs: 16.8 10*3/uL — ABNORMAL HIGH (ref 1.7–7.7)
Platelets: 293 10*3/uL (ref 150–400)
RBC: 3.82 MIL/uL — ABNORMAL LOW (ref 3.87–5.11)
RDW: 15.4 % (ref 11.5–15.5)
WBC: 18.6 10*3/uL — AB (ref 4.0–10.5)

## 2016-03-20 MED ORDER — HEPARIN SODIUM (PORCINE) 5000 UNIT/ML IJ SOLN
5000.0000 [IU] | Freq: Three times a day (TID) | INTRAMUSCULAR | Status: DC
  Administered 2016-03-21 – 2016-03-26 (×16): 5000 [IU] via SUBCUTANEOUS
  Filled 2016-03-20 (×17): qty 1

## 2016-03-20 MED ORDER — ALBUTEROL SULFATE (2.5 MG/3ML) 0.083% IN NEBU: 2.5000 mg | INHALATION_SOLUTION | Freq: Four times a day (QID) | RESPIRATORY_TRACT | Status: DC | PRN

## 2016-03-20 MED ORDER — DEXTROSE 5 % IV SOLN
1.0000 g | INTRAVENOUS | Status: DC
  Administered 2016-03-20 – 2016-03-25 (×6): 1 g via INTRAVENOUS
  Filled 2016-03-20 (×6): qty 1

## 2016-03-20 MED ORDER — SODIUM CHLORIDE 0.9 % IV SOLN
INTRAVENOUS | Status: DC
  Administered 2016-03-21 – 2016-03-23 (×5): via INTRAVENOUS

## 2016-03-20 MED ORDER — VANCOMYCIN HCL 500 MG IV SOLR
500.0000 mg | INTRAVENOUS | Status: DC
  Administered 2016-03-21 – 2016-03-22 (×3): 500 mg via INTRAVENOUS
  Filled 2016-03-20 (×4): qty 500

## 2016-03-20 MED ORDER — SODIUM CHLORIDE 0.9 % IV BOLUS (SEPSIS)
1000.0000 mL | Freq: Once | INTRAVENOUS | Status: AC
  Administered 2016-03-20: 1000 mL via INTRAVENOUS

## 2016-03-20 NOTE — Progress Notes (Signed)
Pharmacy Antibiotic Note  Jamie Lester is a 82 y.o. female admitted on 03/20/2016 with pneumonia.  Pharmacy has been consulted for Vancomycin and Cefepime dosing.  CrCl~ 22 ml/min  Plan: Vancomycin 500 mg IV q24h. Cefepime 1g IV q24h.    Temp (24hrs), Avg:97.9 F (36.6 C), Min:97.9 F (36.6 C), Max:97.9 F (36.6 C)   Recent Labs Lab 03/20/16 1815  WBC 18.6*  CREATININE 1.29*    CrCl cannot be calculated (Unknown ideal weight.).    No Known Allergies  Antimicrobials this admission: 12/27 Vancomycin >>  12/27 Cefepime >>   Dose adjustments this admission:   Microbiology results:  Thank you for allowing pharmacy to be a part of this patient's care.  Hershal Coria 03/20/2016 8:49 PM

## 2016-03-20 NOTE — ED Notes (Signed)
Patient transported to X-ray 

## 2016-03-20 NOTE — H&P (Signed)
History and Physical    Jamie Lester [id number] DOB: 11-19-1928 DOA: 03/20/2016   PCP: Cammy Copa, MD Chief Complaint:  Chief Complaint  Patient presents with  . Altered Mental Status    HPI: Jamie Lester is a 82 y.o. female with medical history significant of HTN, Dementia and deteriorating status over past 6 months, currently at baseline it sounds like she hasnt walked in a couple of months, rarely speaks but hallucinates when she does, and has had poor PO intake and massive weight loss over last couple of months.  Patient was admitted earlier this month with LLL PNA, treated and discharged to NH.  At NH she has continued to do very poorly, worsening again about a week ago and no PO intake for past 4 days.  ED Course: LLL PNA, WBC up to 18k from 9k at discharge on the 10th.  Review of Systems: Unable to perform due to mental status.   Past Medical History:  Diagnosis Date  . CKD (chronic kidney disease)   . Dementia   . Diverticulosis   . Hemorrhoid   . Hypercholesteremia   . Hypertension   . Liver disease   . Pneumonia 02/2016  . Protein calorie malnutrition ([place])     Past Surgical History:  Procedure Laterality Date  . CATARACT EXTRACTION    . CESAREAN SECTION       reports that she has never smoked. She has never used smokeless tobacco. She reports that she does not drink alcohol or use drugs.  No Known Allergies  Family History  Problem Relation Age of Onset  . Colon cancer Neg Hx       Prior to Admission medications   Medication Sig Start Date End Date Taking? Authorizing Provider  amLODipine (NORVASC) 2.5 MG tablet Take 2.5 mg by mouth daily after breakfast.  06/26/15  Yes Historical Provider, MD  aspirin EC 81 MG tablet Take 81 mg by mouth daily after breakfast.   Yes Historical Provider, MD  cefTRIAXone (ROCEPHIN) 1 g injection Inject 1 g into the muscle daily. For pneumonia- started 12/25 x 7 days   Yes Historical Provider, MD   Multiple Vitamins-Minerals (THERAGRAN-M PREMIER 50 PLUS) TABS Take 1 tablet by mouth daily.   Yes Historical Provider, MD  senna-docusate (SENOKOT-S) 8.6-50 MG tablet Take 1 tablet by mouth 2 (two) times daily. 03/06/16  Yes Bonnielee Haff, MD  albuterol (PROVENTIL) (2.5 MG/3ML) 0.083% nebulizer solution Take 3 mLs (2.5 mg total) by nebulization every 6 (six) hours as needed for wheezing or shortness of breath. 03/06/16   Bonnielee Haff, MD  feeding supplement, ENSURE ENLIVE, (ENSURE ENLIVE) LIQD Take 237 mLs by mouth 2 (two) times daily between meals. 03/06/16   Bonnielee Haff, MD  traZODone (DESYREL) 50 MG tablet Take 0.5 tablets (25 mg total) by mouth at bedtime as needed for sleep. 03/06/16   Bonnielee Haff, MD    Physical Exam: Vitals:   03/20/16 1726 03/20/16 1727 03/20/16 2002  BP:  157/85 168/84  Pulse:  85 77  Resp:  16 16  Temp:  97.9 F (36.6 C)   SpO2: 91% 95% 90%       Constitutional: NAD, calm, comfortable, profoundly demented, non-verbal, malnourished with temporal muscle wasting Eyes: PERRL, lids and conjunctivae normal ENMT: Mucous membranes are moist. Posterior pharynx clear of any exudate or lesions.Normal dentition.  Neck: normal, supple, no masses, no thyromegaly Respiratory: clear to auscultation bilaterally, no wheezing, no crackles. Normal respiratory effort. No accessory muscle use.  Cardiovascular: Regular rate and rhythm, no murmurs / rubs / gallops. No extremity edema. 2+ pedal pulses. No carotid bruits.  Abdomen: no tenderness, no masses palpated. No hepatosplenomegaly. Bowel sounds positive.  Musculoskeletal: Muscle wasting noted.  Patient flinches on attempted dorsiflexion of RLE and passive movement of this extremity. Skin: no rashes, lesions, ulcers. No induration Neurologic: Grossly non-focal, does have severe dementia it seems. Psychiatric: Non-verbal, demented, will look around room, eyes open.   Labs on Admission: I have personally reviewed  following labs and imaging studies  CBC:  Recent Labs Lab 03/20/16 1815  WBC 18.6*  NEUTROABS 16.8*  HGB 11.8*  HCT 35.8*  MCV 93.7  PLT 0000000   Basic Metabolic Panel:  Recent Labs Lab 03/20/16 1815  NA 151*  K 4.3  CL 116*  CO2 23  GLUCOSE 115*  BUN 65*  CREATININE 1.29*  CALCIUM 10.5*   GFR: CrCl cannot be calculated (Unknown ideal weight.). Liver Function Tests:  Recent Labs Lab 03/20/16 1815  AST 90*  ALT 146*  ALKPHOS 80  BILITOT 0.4  PROT 7.2  ALBUMIN 2.9*   No results for input(s): LIPASE, AMYLASE in the last 168 hours. No results for input(s): AMMONIA in the last 168 hours. Coagulation Profile: No results for input(s): INR, PROTIME in the last 168 hours. Cardiac Enzymes: No results for input(s): CKTOTAL, CKMB, CKMBINDEX, TROPONINI in the last 168 hours. BNP (last 3 results) No results for input(s): PROBNP in the last 8760 hours. HbA1C: No results for input(s): HGBA1C in the last 72 hours. CBG: No results for input(s): GLUCAP in the last 168 hours. Lipid Profile: No results for input(s): CHOL, HDL, LDLCALC, TRIG, CHOLHDL, LDLDIRECT in the last 72 hours. Thyroid Function Tests: No results for input(s): TSH, T4TOTAL, FREET4, T3FREE, THYROIDAB in the last 72 hours. Anemia Panel: No results for input(s): VITAMINB12, FOLATE, FERRITIN, TIBC, IRON, RETICCTPCT in the last 72 hours. Urine analysis:    Component Value Date/Time   COLORURINE YELLOW 03/20/2016 1800   APPEARANCEUR CLOUDY (A) 03/20/2016 1800   LABSPEC 1.021 03/20/2016 1800   PHURINE 5.0 03/20/2016 1800   GLUCOSEU NEGATIVE 03/20/2016 1800   HGBUR LARGE (A) 03/20/2016 1800   BILIRUBINUR NEGATIVE 03/20/2016 1800   KETONESUR NEGATIVE 03/20/2016 1800   PROTEINUR 30 (A) 03/20/2016 1800   NITRITE NEGATIVE 03/20/2016 1800   LEUKOCYTESUR TRACE (A) 03/20/2016 1800   Sepsis Labs: @LABRCNTIP (procalcitonin:4,lacticidven:4) )No results found for this or any previous visit (from the past 240  hour(s)).   Radiological Exams on Admission: Dg Chest 2 View  Result Date: 03/20/2016 CLINICAL DATA:  Altered mental status. EXAM: CHEST  2 VIEW COMPARISON:  03/02/2016 FINDINGS: Left base airspace disease again noted, slightly improved concerning for residual infiltrate/pneumonia. Right lung is clear. Heart is upper limits normal in size. No visible effusions or acute bony abnormality. IMPRESSION: Continued left lower lobe airspace opacity, slightly improved since prior study concerning for residual pneumonia. Electronically Signed   By: Rolm Baptise M.D.   On: 03/20/2016 17:57    EKG: Independently reviewed.  Assessment/Plan Principal Problem:   Left lower lobe pneumonia (HCC) Active Problems:   Dementia   AKI (acute kidney injury) ([place])   Essential hypertension   Acute encephalopathy   Adult failure to thrive   Decubitus ulcer of sacral region, stage 1    1. LLL PNA - 1. PNA pathway 2. Cefepime and vanc due to persistence / recurrence 3. Cultures pending 2. AKI - profoundly dehydrated patient due to no PO intake with  sodium of 151 today 1. IVF bolus and 125 cc/hr 2. Repeat BMP in AM 3. Acute encephalopathy - due to acute illness on top of baseline end-stage dementia 4. Adult failure to thrive and dementia - 1. Long discussion with family 2. At baseline even before first PNA earlier this month, patient had been going down hill dramatically from functional standpoint 3. At baseline: 1. she has minimal (and insufficient) PO intake 2. She is bed bound and does not ambulate 3. Progressive decline over 6 months 4. Likely warrants pal care consult, and probably a candidate for hospice. 5. Discussed code status, feeding tube, etc with family 5. Stage 1 sacral decubitus - wound care consult ordered 6. RLE apparent tenderness on exam - plain film X rays of hip, knee, ankle ordered.   DVT prophylaxis: Heparin [place] Code Status: DNR / DNI, discussed with family, they also open to  pal care consult and hospice since they agree mother would not want feeding tube either or be kept alive artificially with such profound dementia. Family Communication: Family at bedside Consults called: None Admission status: Admit to inpatient   Etta Quill DO [facility] Pager [phone] from 7PM-7AM  If 7AM-7PM, please contact the day physician for the patient [email or web address] Password [email or web address]  03/20/2016, 9:43 PM

## 2016-03-20 NOTE — ED Triage Notes (Signed)
Per EMS, patient from [facility], staff reports she has had poor PO intake x4 days. Patient passed swallow screen for a liquid diet, however she continues to put the liquid in her mouth and spit it out. Hx dementia. Nonverbal. Dx with pneumonia on 12/25.

## 2016-03-20 NOTE — ED Notes (Signed)
Bed: WA01 Expected date:  Expected time:  Means of arrival:  Comments: EMS-FTT 

## 2016-03-20 NOTE — ED Notes (Signed)
Hospitalist at bedside 

## 2016-03-20 NOTE — ED Provider Notes (Signed)
[facility] DEPT Provider Note   CSN: [id number] Arrival date & time: 03/20/16  1718     History   Chief Complaint Chief Complaint  Patient presents with  . Failure To Thrive    HPI Jamie Lester is a 82 y.o. female.  HPI  Patient presents from nursing facility with staff concerns of decreased interactivity, anorexia. No report of new fever, cough. Patient has dementia, is nonverbal at baseline, level V caveat. Staff reports the patient was diagnosed pneumonia about one week ago It is unclear if she is currently taking antibiotics, as the staff notes suggest the patient has not taken anything by mouth 4 days, but also state that the patient has been taking antibiotics.   Past Medical History:  Diagnosis Date  . CKD (chronic kidney disease)   . Dementia   . Diverticulosis   . Hemorrhoid   . Hypercholesteremia   . Hypertension   . Liver disease   . Pneumonia 02/2016  . Protein calorie malnutrition [facility])     Patient Active Problem List   Diagnosis Date Noted  . Acute encephalopathy 03/02/2016  . PNA (pneumonia) 03/02/2016  . Elevated troponin 03/02/2016  . Generalized weakness 03/02/2016  . Elevated transaminase level 03/02/2016  . Protein-calorie malnutrition, severe 03/02/2016  . Liver disease   . CKD (chronic kidney disease)   . Anemia of chronic disease 10/19/2015  . Acute lower GI bleeding 07/14/2015  . AKI (acute kidney injury) ([place]) 07/14/2015  . Dementia   . Hypertension   . Hypercholesteremia   . Acute blood loss anemia   . Essential hypertension     Past Surgical History:  Procedure Laterality Date  . CATARACT EXTRACTION    . CESAREAN SECTION      OB History    No data available       Home Medications    Prior to Admission medications   Medication Sig Start Date End Date Taking? Authorizing Provider  amLODipine (NORVASC) 2.5 MG tablet Take 2.5 mg by mouth daily after breakfast.  06/26/15  Yes Historical Provider, MD    amoxicillin-clavulanate (AUGMENTIN) 875-125 MG tablet Take 1 tablet by mouth every 12 (twelve) hours.   Yes Historical Provider, MD  aspirin EC 81 MG tablet Take 81 mg by mouth daily after breakfast.   Yes Historical Provider, MD  cefTRIAXone (ROCEPHIN) 1 g injection Inject 1 g into the muscle daily. For pneumonia- started 12/25 x 7 days   Yes Historical Provider, MD  Multiple Vitamins-Minerals (THERAGRAN-M PREMIER 50 PLUS) TABS Take 1 tablet by mouth daily.   Yes Historical Provider, MD  senna-docusate (SENOKOT-S) 8.6-50 MG tablet Take 1 tablet by mouth 2 (two) times daily. 03/06/16  Yes Bonnielee Haff, MD  albuterol (PROVENTIL) (2.5 MG/3ML) 0.083% nebulizer solution Take 3 mLs (2.5 mg total) by nebulization every 6 (six) hours as needed for wheezing or shortness of breath. 03/06/16   Bonnielee Haff, MD  feeding supplement, ENSURE ENLIVE, (ENSURE ENLIVE) LIQD Take 237 mLs by mouth 2 (two) times daily between meals. 03/06/16   Bonnielee Haff, MD  traZODone (DESYREL) 50 MG tablet Take 0.5 tablets (25 mg total) by mouth at bedtime as needed for sleep. 03/06/16   Bonnielee Haff, MD    Family History Family History  Problem Relation Age of Onset  . Colon cancer Neg Hx     Social History Social History  Substance Use Topics  . Smoking status: Never Smoker  . Smokeless tobacco: Never Used  . Alcohol use No  Allergies   Patient has no known allergies.   Review of Systems Review of Systems  Unable to perform ROS: Dementia     Physical Exam Updated Vital Signs BP 157/85   Pulse 85   Temp 97.9 F (36.6 C)   Resp 16   SpO2 95%   Physical Exam  Constitutional:  Chronically ill-appearing frail female sitting upright in no distress reading easily no gross evidence for acute illness  HENT:  Head: Normocephalic and atraumatic.  Eyes: Conjunctivae and EOM are normal.  Cardiovascular: Normal rate and regular rhythm.   Pulmonary/Chest: No stridor.  Diminished breath sounds  bilaterally  Abdominal: She exhibits no distension.  Musculoskeletal: She exhibits no edema.  Neurological: She is alert. She displays atrophy. She displays no tremor. No cranial nerve deficit.  Patient is nonverbal, but does seem to respond with head nodding appropriately to simple questions  Skin: Skin is warm and dry.  Psychiatric:  Impaired  Nursing note and vitals reviewed.    ED Treatments / Results  Labs (all labs ordered are listed, but only abnormal results are displayed) Labs Reviewed  COMPREHENSIVE METABOLIC PANEL - Abnormal; Notable for the following:       Result Value   Sodium 151 (*)    Chloride 116 (*)    Glucose, Bld 115 (*)    BUN 65 (*)    Creatinine, Ser 1.29 (*)    Calcium 10.5 (*)    Albumin 2.9 (*)    AST 90 (*)    ALT 146 (*)    GFR calc non Af Amer 37 (*)    GFR calc Af Amer 43 (*)    All other components within normal limits  CBC WITH DIFFERENTIAL/PLATELET - Abnormal; Notable for the following:    WBC 18.6 (*)    RBC 3.82 (*)    Hemoglobin 11.8 (*)    HCT 35.8 (*)    Neutro Abs 16.8 (*)    All other components within normal limits  URINALYSIS, ROUTINE W REFLEX MICROSCOPIC - Abnormal; Notable for the following:    APPearance CLOUDY (*)    Hgb urine dipstick LARGE (*)    Protein, ur 30 (*)    Leukocytes, UA TRACE (*)    Bacteria, UA RARE (*)    Squamous Epithelial / LPF 0-5 (*)    All other components within normal limits    Chart review notable for recent evaluation for pneumonia  Radiology Dg Chest 2 View  Result Date: 03/20/2016 CLINICAL DATA:  Altered mental status. EXAM: CHEST  2 VIEW COMPARISON:  03/02/2016 FINDINGS: Left base airspace disease again noted, slightly improved concerning for residual infiltrate/pneumonia. Right lung is clear. Heart is upper limits normal in size. No visible effusions or acute bony abnormality. IMPRESSION: Continued left lower lobe airspace opacity, slightly improved since prior study concerning for  residual pneumonia. Electronically Signed   By: Rolm Baptise M.D.   On: 03/20/2016 17:57    Procedures Procedures (including critical care time)  Medications Ordered in ED Medications  sodium chloride 0.9 % bolus 1,000 mL (not administered)  albuterol (PROVENTIL) (2.5 MG/3ML) 0.083% nebulizer solution 2.5 mg (not administered)  0.9 %  sodium chloride infusion (not administered)     Initial Impression / Assessment and Plan / ED Course  I have reviewed the triage vital signs and the nursing notes.  Pertinent labs & imaging results that were available during my care of the patient were reviewed by me and considered in my medical decision  making (see chart for details).  Clinical Course   8:38 PM Patient's son is here with her. He confirms that the patient until recently wasn't verbal, but acknowledges that her baseline mental status is changed substantially over the past few weeks. We discussed findings today including critically abnormal sodium value, concern for ongoing pneumonia. He reiterates that the patient has not been taking any oral intake since discharge to nursing facility. Patient continues to receive fluid resuscitation, and given concern for pneumonia, and her personal status, hoarseness position, will be restarted on healthcare acquired pneumonia appropriate antibiotics.  We reviewed goals of care, the patient has limited resuscitation status, fluids, noninvasive support. Final Clinical Impressions(s) / ED Diagnoses   Final diagnoses:  Altered mental status, unspecified altered mental status type  Hypernatremia  HCAP (healthcare-associated pneumonia)   CRITICAL CARE Performed by: Carmin Muskrat Total critical care time: 40 minutes Critical care time was exclusive of separately billable procedures and treating other patients. Critical care was necessary to treat or prevent imminent or life-threatening deterioration. Critical care was time spent personally by me on  the following activities: development of treatment plan with patient and/or surrogate as well as nursing, discussions with consultants, evaluation of patient's response to treatment, examination of patient, obtaining history from patient or surrogate, ordering and performing treatments and interventions, ordering and review of laboratory studies, ordering and review of radiographic studies, pulse oximetry and re-evaluation of patient's condition.    Carmin Muskrat, MD 03/20/16 2040

## 2016-03-21 DIAGNOSIS — Z515 Encounter for palliative care: Secondary | ICD-10-CM

## 2016-03-21 LAB — BASIC METABOLIC PANEL
Anion gap: 13 (ref 5–15)
BUN: 62 mg/dL — AB (ref 6–20)
CHLORIDE: 117 mmol/L — AB (ref 101–111)
CO2: 18 mmol/L — AB (ref 22–32)
CREATININE: 1.22 mg/dL — AB (ref 0.44–1.00)
Calcium: 9.7 mg/dL (ref 8.9–10.3)
GFR calc Af Amer: 45 mL/min — ABNORMAL LOW (ref >60)
GFR calc non Af Amer: 39 mL/min — ABNORMAL LOW (ref >60)
GLUCOSE: 150 mg/dL — AB (ref 65–99)
POTASSIUM: 3.9 mmol/L (ref 3.5–5.1)
Sodium: 148 mmol/L — ABNORMAL HIGH (ref 135–145)

## 2016-03-21 LAB — STREP PNEUMONIAE URINARY ANTIGEN: Strep Pneumo Urinary Antigen: NEGATIVE

## 2016-03-21 LAB — CBC
HEMATOCRIT: 32.8 % — AB (ref 36.0–46.0)
Hemoglobin: 11.1 g/dL — ABNORMAL LOW (ref 12.0–15.0)
MCH: 31.2 pg (ref 26.0–34.0)
MCHC: 33.8 g/dL (ref 30.0–36.0)
MCV: 92.1 fL (ref 78.0–100.0)
PLATELETS: 273 10*3/uL (ref 150–400)
RBC: 3.56 MIL/uL — ABNORMAL LOW (ref 3.87–5.11)
RDW: 15.1 % (ref 11.5–15.5)
WBC: 19.4 10*3/uL — AB (ref 4.0–10.5)

## 2016-03-21 LAB — HIV ANTIBODY (ROUTINE TESTING W REFLEX): HIV Screen 4th Generation wRfx: NONREACTIVE

## 2016-03-21 NOTE — Consult Note (Signed)
Consultation Note Date: 03/21/2016   Patient Name: Jamie Lester  DOB: 17-Jul-1928  MRN: [id number]  Age / Sex: 82 y.o., female  PCP: Aura Dials, MD Referring Physician: Eber Jones, MD  Reason for Consultation: Establishing goals of care  HPI/Patient Profile: 82 y.o. female    admitted on 03/20/2016     Clinical Assessment and Goals of Care:  Jamie Lester is a 82 y.o. female with medical history significant of HTN, Dementia and deteriorating status over past 6 months, currently at baseline it sounds like she hasnt walked in a couple of months, rarely speaks but hallucinates when she does, and has had poor PO intake and massive weight loss over last couple of months.   Patient was admitted earlier this month with LLL PNA, treated and discharged to NH.  At NH she has continued to do very poorly, worsening again about a week ago and no PO intake for past 4 days.  A palliative consult has been placed for goals of care discussions.   The patient is resting in bed, she tracks me in the room, nods her head appropriately. No family at bedside earlier today, hence call placed and discussed with son Myna Bright who is the patient's primary caregiver and lives with them. He notified me that the patient's husband and daughter will arrive at the bedside soon and that it was ok for me to initiative palliative discussions with them.   Patient's husband and daughter arrived at the bedside. I introduced myself and palliative care as follows: Palliative medicine is specialized medical care for people living with serious illness. It focuses on providing relief from the symptoms and stress of a serious illness. The goal is to improve quality of life for both the patient and the family.  We discussed about the patient's current condition, recurrent lung infections, not eating, ongoing weight loss and decline.  We discussed about dementia trajectory in general. Discussed about comfort feeds, no PEG, hospice following the patient if she were to be able to return back to [facility]. Also discussed about residential hospice.   Patient's daughter would like to have a repeat family meeting, when both sons are also present. I have left my card with them, await call back regarding time for family meeting tomorrow.   Thank you for the consult, we will follow along.   NEXT OF KIN  has husband of 65+ years, has 2 sons and 1 daughter.   SUMMARY OF RECOMMENDATIONS    Family meeting with husband, son main caregiver Myna Bright, other son and daughter to be scheduled for 03-12-16.  In initial discussions today, we discussed about recommendations for a more comfort based approach to care, in particular, we discussed about hospice care.   Code Status/Advance Care Planning:  DNR    Symptom Management:    as above   Palliative Prophylaxis:   Bowel Regimen   Psycho-social/Spiritual:   Desire for further Chaplaincy support:no  Additional Recommendations: Education on Hospice  Prognosis:   Guarded, ?few weeks  Discharge Planning: To Be Determined      Primary Diagnoses: Present on Admission: . Left lower lobe pneumonia ([place]) . Acute encephalopathy . Dementia . AKI (acute kidney injury) ([place]) . Essential hypertension . Adult failure to thrive . Decubitus ulcer of sacral region, stage 1   I have reviewed the medical record, interviewed the patient and family, and examined the patient. The following aspects are pertinent.  Past Medical History:  Diagnosis Date  . CKD (chronic kidney disease)   . Dementia   . Diverticulosis   . Hemorrhoid   . Hypercholesteremia   . Hypertension   . Liver disease   . Pneumonia 02/2016  . Protein calorie malnutrition ([place])    Social History   Social History  . Marital status: Married    Spouse name: N/A  . Number of children: N/A  . Years of  education: N/A   Social History Main Topics  . Smoking status: Never Smoker  . Smokeless tobacco: Never Used  . Alcohol use No  . Drug use: No  . Sexual activity: Not Asked   Other Topics Concern  . None   Social History Narrative  . None   Family History  Problem Relation Age of Onset  . Colon cancer Neg Hx    Scheduled Meds: . ceFEPime (MAXIPIME) IV  1 g Intravenous Q24H  . heparin  5,000 Units Subcutaneous Q8H  . vancomycin  500 mg Intravenous Q24H   Continuous Infusions: . sodium chloride 125 mL/hr at 03/21/16 1447   PRN Meds:.albuterol Medications Prior to Admission:  Prior to Admission medications   Medication Sig Start Date End Date Taking? Authorizing Provider  amLODipine (NORVASC) 2.5 MG tablet Take 2.5 mg by mouth daily after breakfast.  06/26/15  Yes Historical Provider, MD  aspirin EC 81 MG tablet Take 81 mg by mouth daily after breakfast.   Yes Historical Provider, MD  cefTRIAXone (ROCEPHIN) 1 g injection Inject 1 g into the muscle daily. For pneumonia- started 12/25 x 7 days   Yes Historical Provider, MD  Multiple Vitamins-Minerals (THERAGRAN-M PREMIER 50 PLUS) TABS Take 1 tablet by mouth daily.   Yes Historical Provider, MD  senna-docusate (SENOKOT-S) 8.6-50 MG tablet Take 1 tablet by mouth 2 (two) times daily. 03/06/16  Yes Bonnielee Haff, MD  albuterol (PROVENTIL) (2.5 MG/3ML) 0.083% nebulizer solution Take 3 mLs (2.5 mg total) by nebulization every 6 (six) hours as needed for wheezing or shortness of breath. 03/06/16   Bonnielee Haff, MD  feeding supplement, ENSURE ENLIVE, (ENSURE ENLIVE) LIQD Take 237 mLs by mouth 2 (two) times daily between meals. 03/06/16   Bonnielee Haff, MD  traZODone (DESYREL) 50 MG tablet Take 0.5 tablets (25 mg total) by mouth at bedtime as needed for sleep. 03/06/16   Bonnielee Haff, MD   No Known Allergies Review of Systems Non verbal  Physical Exam Thin frail weak NAD Congestion S1 S2 Abdomen soft Muscle wasting No  edema Awake not alert Non verbal Is able to appropriately shake head yes/no  Vital Signs: BP 139/89 (BP Location: Right Arm)   Pulse 88   Temp 98 F (36.7 C) (Axillary)   Resp 16   Ht 5\' 2"  (1.575 m)   Wt 44.5 kg (98 lb)   SpO2 96%   BMI 17.92 kg/m  Pain Assessment: Faces       SpO2: SpO2: 96 % O2 Device:SpO2: 96 % O2 Flow Rate: .   IO: Intake/output summary:  Intake/Output Summary (Last 24 hours) at 03/21/16 1720  Last data filed at 03/21/16 0645  Gross per 24 hour  Intake             1150 ml  Output                0 ml  Net             1150 ml    LBM: Last BM Date: 03/21/16 Baseline Weight: Weight: 44.5 kg (98 lb) Most recent weight: Weight:  (pt. is nonverbal and no bedscale...family not available)     Palliative Assessment/Data:   Flowsheet Rows   Flowsheet Row Most Recent Value  Intake Tab  Referral Department  Hospitalist  Unit at Time of Referral  Med/Surg Unit  Palliative Care Primary Diagnosis  Neurology  Palliative Care Type  New Palliative care  Reason for referral  Non-pain Symptom, Clarify Goals of Care, Counsel Regarding Hospice  Date first seen by Palliative Care  03/21/16  Clinical Assessment  Palliative Performance Scale Score  30%  Pain Max last 24 hours  4  Pain Min Last 24 hours  3  Dyspnea Max Last 24 Hours  3  Dyspnea Min Last 24 hours  2  Psychosocial & Spiritual Assessment  Palliative Care Outcomes  Patient/Family meeting held?  Yes  Who was at the meeting?  daughter husband   Palliative Care Outcomes  Clarified goals of care      Time In:  1620 Time Out:  1720 Time Total:  60 min  Greater than 50%  of this time was spent counseling and coordinating care related to the above assessment and plan.  Signed by: Loistine Chance, MD  [phone]  Please contact Palliative Medicine Team phone at [phone] for questions and concerns.  For individual provider: See Shea Evans

## 2016-03-21 NOTE — Consult Note (Signed)
[facility] Nurse wound consult note Reason for Consult: moisture associated skin damage (MASD), specifically, incontinence associated skin damage (IAD). Patient is a frail elder with dual incontinence. Wound type: MASD, specifically, IAD Pressure Ulcer POA: No Measurement: few scattered partial thickness areas in the perianal area.  Sacrum and heels intact. Wound [id number], moist Drainage (amount, consistency, odor) scant serous Periwound: erythematous Dressing procedure/placement/frequency: Orders are provided for nursing for timely incontinence care using house products and turning and repositioning. Flotation of heels. [facility] nursing team will not follow, but will remain available to this patient, the nursing and medical teams.  Please re-consult if needed. Thanks, Maudie Flakes, MSN, RN, [place], Arther Abbott  Pager# [phone]

## 2016-03-21 NOTE — Progress Notes (Signed)
PROGRESS NOTE    Jamie Lester  [id number] DOB: 1928-10-25 DOA: 03/20/2016 PCP: Cammy Copa, MD    Brief Narrative:  Jamie Lester is a 82 y.o. female with medical history significant of HTN, Dementia and deteriorating status over past 6 months, currently at baseline it sounds like she hasnt walked in a couple of months, rarely speaks but hallucinates when she does, and has had poor PO intake and massive weight loss over last couple of months.  Patient was admitted earlier this month with LLL PNA, treated and discharged to NH.  At NH she has continued to do very poorly, worsening again about a week ago and no PO intake for past 4 days.  ED Course: LLL PNA, WBC up to 18k from 9k at discharge on the 10th.   Assessment & Plan:   Principal Problem:   Left lower lobe pneumonia ([name]) Active Problems:   Dementia   AKI (acute kidney injury) ([place])   Essential hypertension   Acute encephalopathy   Adult failure to thrive   Decubitus ulcer of sacral region, stage 1  LLL PNA  -PNA pathway - Cefepime and vanc due to persistence / recurrence - Cultures pending - family understands that this may treat infection but will not improve functional status  AKI - profoundly dehydrated patient due to no PO intake with sodium of 151 at admission - repeat sodium of 148 today - IVF bolus and 125 cc/hr - will d/c blood draws if patient family opts for comfort based measures only  Acute encephalopathy - due to acute illness on top of baseline end-stage dementia  Adult failure to thrive and dementia  -  patient had been going down hill dramatically from functional standpoint - At baseline: she has minimal (and insufficient) PO intake, is bed bound and does not ambulate - Palliative Care consulted- patient family seems ready for possibly transitioning to comfort measures only  Stage 1 sacral decubitus  - wound care consult ordered  RLE apparent tenderness on exam  -  plain film X rays of hip, knee, ankle ordered.   DVT prophylaxis: Heparin  Code Status: DNR / DNI, discussed with family- would like to talk with palliative care Family Communication: Son and husband are bedside Disposition: may d/c when medically stable to previous facility- family considering hospice services  Consultants:   Palliative Care  Procedures:   None  Antimicrobials:   Cefepime  Vancomycin    Subjective: Patient seen and evaluated- she tracks with her eyes but is nonverbal.  Objective: Vitals:   03/20/16 2002 03/20/16 2208 03/20/16 2300 03/21/16 0559  BP: 168/84 181/83 (!) 184/87 139/89  Pulse: 77 79 74 88  Resp: 16 16 16 16   Temp:   98.1 F (36.7 C) 98 F (36.7 C)  TempSrc:   Oral Axillary  SpO2: 90% 94% 96% 96%    Intake/Output Summary (Last 24 hours) at 03/21/16 1230 Last data filed at 03/21/16 0645  Gross per 24 hour  Intake             1150 ml  Output                0 ml  Net             1150 ml   Filed Weights    Examination:  General exam: Appears calm, thin, frail appearing female Respiratory system: Significant upper airway noises, rhonchi in middle lung fields Respiratory effort normal. Cardiovascular system: S1 & S2 heard, RRR.  No JVD, murmurs, rubs, gallops or clicks. No pedal edema. Gastrointestinal system: Abdomen is nondistended, soft and nontender. No organomegaly or masses felt. Normal bowel sounds heard. Central nervous system: Alert. Extremities: unable to evaluate- able to move arms spontaneously bilaterally Skin: No rashes, lesions or ulcers Psychiatry: unable to evaluate.     Data Reviewed: I have personally reviewed following labs and imaging studies  CBC:  Recent Labs Lab 03/20/16 1815 03/21/16 0418  WBC 18.6* 19.4*  NEUTROABS 16.8*  --   HGB 11.8* 11.1*  HCT 35.8* 32.8*  MCV 93.7 92.1  PLT 293 [id number]   Basic Metabolic Panel:  Recent Labs Lab 03/20/16 1815 03/21/16 0418  NA 151* 148*  K 4.3 3.9    CL 116* 117*  CO2 23 18*  GLUCOSE 115* 150*  BUN 65* 62*  CREATININE 1.29* 1.22*  CALCIUM 10.5* 9.7   GFR: CrCl cannot be calculated (Unknown ideal weight.). Liver Function Tests:  Recent Labs Lab 03/20/16 1815  AST 90*  ALT 146*  ALKPHOS 80  BILITOT 0.4  PROT 7.2  ALBUMIN 2.9*   No results for input(s): LIPASE, AMYLASE in the last 168 hours. No results for input(s): AMMONIA in the last 168 hours. Coagulation Profile: No results for input(s): INR, PROTIME in the last 168 hours. Cardiac Enzymes: No results for input(s): CKTOTAL, CKMB, CKMBINDEX, TROPONINI in the last 168 hours. BNP (last 3 results) No results for input(s): PROBNP in the last 8760 hours. HbA1C: No results for input(s): HGBA1C in the last 72 hours. CBG: No results for input(s): GLUCAP in the last 168 hours. Lipid Profile: No results for input(s): CHOL, HDL, LDLCALC, TRIG, CHOLHDL, LDLDIRECT in the last 72 hours. Thyroid Function Tests: No results for input(s): TSH, T4TOTAL, FREET4, T3FREE, THYROIDAB in the last 72 hours. Anemia Panel: No results for input(s): VITAMINB12, FOLATE, FERRITIN, TIBC, IRON, RETICCTPCT in the last 72 hours. Sepsis Labs: No results for input(s): PROCALCITON, LATICACIDVEN in the last 168 hours.  No results found for this or any previous visit (from the past 240 hour(s)).       Radiology Studies: Dg Chest 2 View  Result Date: 03/20/2016 CLINICAL DATA:  Altered mental status. EXAM: CHEST  2 VIEW COMPARISON:  03/02/2016 FINDINGS: Left base airspace disease again noted, slightly improved concerning for residual infiltrate/pneumonia. Right lung is clear. Heart is upper limits normal in size. No visible effusions or acute bony abnormality. IMPRESSION: Continued left lower lobe airspace opacity, slightly improved since prior study concerning for residual pneumonia. Electronically Signed   By: Rolm Baptise M.D.   On: 03/20/2016 17:57   Dg Ankle Complete Right  Result Date:  03/20/2016 CLINICAL DATA:  Patient is non verbal, family states she has not had any known injury. Patient was brought in for a cough. When MD flexes patient's right ankle she flinches like she is in pain, checking for fracture. EXAM: RIGHT ANKLE - COMPLETE 3+ VIEW COMPARISON:  None. FINDINGS: Lateral view limited by positioning. There is no evidence of fracture, dislocation, or joint effusion. There is no evidence of arthropathy or other focal bone abnormality. Soft tissues are unremarkable. IMPRESSION: No acute abnormality of the right ankle. Electronically Signed   By: Jeb Levering M.D.   On: 03/20/2016 22:09   Dg Hip Unilat With Pelvis 1v Right  Result Date: 03/20/2016 CLINICAL DATA:  Right lower extremity pain EXAM: DG HIP (WITH OR WITHOUT PELVIS) 1V RIGHT COMPARISON:  None. FINDINGS: There is no evidence of hip fracture or dislocation. There is  no evidence of arthropathy or other focal bone abnormality. IMPRESSION: No right hip fracture or dislocation. Electronically Signed   By: Ulyses Jarred M.D.   On: 03/20/2016 22:08   Dg Knee 2 Views Right  Result Date: 03/20/2016 CLINICAL DATA:  Patient is non verbal, family states she has not had any known injury. Patient was brought in for a cough. When MD flexes patient's right ankle she flinches like she is in pain, checking for fracture. EXAM: RIGHT KNEE - 3 VIEW COMPARISON:  None. FINDINGS: No evidence of fracture, dislocation, or joint effusion. Mild tricompartmental osteoarthritis. Soft tissues are unremarkable. IMPRESSION: Mild osteoarthritis.  No evidence of acute abnormality. Electronically Signed   By: Jeb Levering M.D.   On: 03/20/2016 22:08        Scheduled Meds: . ceFEPime (MAXIPIME) IV  1 g Intravenous Q24H  . heparin  5,000 Units Subcutaneous Q8H  . vancomycin  500 mg Intravenous Q24H   Continuous Infusions: . sodium chloride 125 mL/hr at 03/21/16 0645     LOS: 1 day    Time spent: 30 minutes    Loretha Stapler,  MD [facility] Pager [phone]  If 7PM-7AM, please contact night-coverage [email or web address] Password [email or web address] 03/21/2016, 12:30 PM

## 2016-03-22 ENCOUNTER — Other Ambulatory Visit: Payer: Medicare Other

## 2016-03-22 LAB — BASIC METABOLIC PANEL
Anion gap: 13 (ref 5–15)
BUN: 77 mg/dL — ABNORMAL HIGH (ref 6–20)
CALCIUM: 8.8 mg/dL — AB (ref 8.9–10.3)
CO2: 16 mmol/L — AB (ref 22–32)
CREATININE: 1 mg/dL (ref 0.44–1.00)
Chloride: 123 mmol/L — ABNORMAL HIGH (ref 101–111)
GFR calc non Af Amer: 50 mL/min — ABNORMAL LOW (ref >60)
GFR, EST AFRICAN AMERICAN: 58 mL/min — AB (ref >60)
Glucose, Bld: 94 mg/dL (ref 65–99)
Potassium: 3.4 mmol/L — ABNORMAL LOW (ref 3.5–5.1)
SODIUM: 152 mmol/L — AB (ref 135–145)

## 2016-03-22 MED ORDER — ADULT MULTIVITAMIN W/MINERALS CH
1.0000 | ORAL_TABLET | Freq: Every day | ORAL | Status: DC
  Administered 2016-03-22: 1 via ORAL
  Filled 2016-03-22 (×2): qty 1

## 2016-03-22 MED ORDER — ENSURE ENLIVE PO LIQD
237.0000 mL | Freq: Three times a day (TID) | ORAL | Status: DC
  Administered 2016-03-22: 237 mL via ORAL

## 2016-03-22 NOTE — Progress Notes (Signed)
Initial Nutrition Assessment  DOCUMENTATION CODES:   Severe malnutrition in context of chronic illness, Underweight  INTERVENTION:  Provide Ensure Enlive po TID, each supplement provides 350 kcal and 20 grams of protein.  Provide multivitamin with minerals daily.  NUTRITION DIAGNOSIS:   Malnutrition (Severe) related to chronic illness as evidenced by severe depletion of body fat, severe depletion of muscle mass.  GOAL:   Patient will meet greater than or equal to 90% of their needs  MONITOR:   PO intake, Supplement acceptance, Labs, Weight trends, I & O's, Skin  REASON FOR ASSESSMENT:   Malnutrition Screening Tool    ASSESSMENT:   82 y.o. female with medical history significant of HTN, Dementia and deteriorating status over past 6 months, currently at baseline it sounds like she hasnt walked in a couple of months, rarely speaks but hallucinates when she does, and has had poor PO intake and massive weight loss over last couple of months.   -Palliative Medicine met with family. Son wants to take patient home with hospice on discharge.   Spoke with patient's family at bedside as patient now participating in history. Family reports that earlier this month when patient was admitted to [facility] she was not eating anything PO. They report her PO intake has improved slightly as she is able to take bites of food. She is able to drink Ensure. Denies N/V, abdominal pain. Family reports patient passed SLP eval.   UBW 120 lbs. Patient has lost 22 lbs (18% body weight) over 3 months, which is significant for time frame.   Medications reviewed and include: vancomycin, NS @ 125 ml/hr.  Labs reviewed: Sodium 148, Chloride 117, CO2 18, BUN 62, Creatinine 1.22.  Nutrition-Focused physical exam completed. Findings are severe fat depletion, severe muscle depletion, and no edema.   Discussed with RN.   Diet Order:  Diet regular Room service appropriate? Yes; Fluid consistency: Thin  Skin:  Wound  (see comment) (Stg II to Sacrum)  Last BM:  03/21/2016  Height:   Ht Readings from Last 1 Encounters:  03/20/16 _0  (1.575 m)    Weight:   Wt Readings from Last 1 Encounters:  03/20/16 98 lb (44.5 kg)    Ideal Body Weight:  50 kg  BMI:  Body mass index is 17.92 kg/m.  Estimated Nutritional Needs:   Kcal:  1350-1550 (30-35 kcal/kg)  Protein:  62-71 grams (1.4-1.6 grams/kg)  Fluid:  >/= 1.1 L/day  EDUCATION NEEDS:   No education needs identified at this time  Willey Blade, MS, RD, LDN Pager: [phone] After Hours Pager: [phone]

## 2016-03-22 NOTE — Progress Notes (Signed)
Daily Progress Note   Patient Name: Jamie Lester       Date: 03/22/2016 DOB: [birth year]  Age: 82 y.o. MRN#: [id number] Attending Physician: Eber Jones, MD Primary Care Physician: Cammy Copa, MD Admit Date: 03/20/2016  Reason for Consultation/Follow-up: Establishing goals of care  Subjective:  patient is much more awake/ alert She was even heard saying "hello" and "jesus", she enjoyed a visit from her grand children earlier today She reportedly even had several bites of food when her son [place] fed her.   See family meeting note below:  Length of Stay: 2  Current Medications: Scheduled Meds:  . ceFEPime (MAXIPIME) IV  1 g Intravenous Q24H  . heparin  5,000 Units Subcutaneous Q8H  . vancomycin  500 mg Intravenous Q24H    Continuous Infusions: . sodium chloride 125 mL/hr at 03/21/16 1447    PRN Meds: albuterol  Physical Exam         Awake alert S1 S2 Coarse breath sounds anteriorly Abdomen soft No edema Dementia No agitation   Vital Signs: BP (!) 144/88 (BP Location: Right Arm)   Pulse (!) 56   Temp 98.4 F (36.9 C) (Axillary)   Resp 14   Ht '5\' 2"'$  (1.575 m)   Wt 44.5 kg (98 lb)   SpO2 99%   BMI 17.92 kg/m  SpO2: SpO2: 99 % O2 Device: O2 Device: Not Delivered O2 Flow Rate:    Intake/output summary: No intake or output data in the 24 hours ending 03/22/16 1631 LBM: Last BM Date: 03/21/16 Baseline Weight: Weight: 44.5 kg (98 lb) Most recent weight: Weight:  (pt. is nonverbal and no bedscale...family not available)       Palliative Assessment/Data:    Flowsheet Rows   Flowsheet Row Most Recent Value  Intake Tab  Referral Department  Hospitalist  Unit at Time of Referral  Med/Surg Unit  Palliative Care Primary Diagnosis  Other  (Comment)  Palliative Care Type  Return patient Palliative Care  Reason for referral  Clarify Goals of Care  Date first seen by Palliative Care  03/21/16  Clinical Assessment  Palliative Performance Scale Score  30%  Pain Max last 24 hours  4  Pain Min Last 24 hours  3  Dyspnea Max Last 24 Hours  4  Dyspnea Min Last 24 hours  3  Nausea Max Last 24 Hours  4  Nausea Min Last 24 Hours  3  Psychosocial & Spiritual Assessment  Palliative Care Outcomes  Patient/Family meeting held?  Yes  Who was at the meeting?  patient husband 2 sons 1 daughter   Palliative Care Outcomes  Clarified goals of care      Patient Active Problem List   Diagnosis Date Noted  . Encounter for palliative care   . Left lower lobe pneumonia ([place]) 03/20/2016  . Adult failure to thrive 03/20/2016  . Decubitus ulcer of sacral region, stage 1 03/20/2016  . Acute encephalopathy 03/02/2016  . PNA (pneumonia) 03/02/2016  . Elevated troponin 03/02/2016  . Generalized weakness 03/02/2016  . Elevated transaminase level 03/02/2016  . Protein-calorie malnutrition, severe 03/02/2016  . Liver disease   . CKD (chronic kidney disease)   . Anemia of chronic disease 10/19/2015  . Acute lower GI bleeding 07/14/2015  . AKI (acute kidney injury) ([place]) 07/14/2015  . Dementia   . Hypertension   . Hypercholesteremia   . Acute blood loss anemia   . Essential hypertension     Palliative Care Assessment & Plan   Patient Profile:    Assessment:  Adult FTT Dementia Acute encephalopathy- resolving Acute kidney injury Not eating Bed bound status Recurrent PNA Stage 1 sacral decub, present on admit   Recommendations/Plan:  Family meeting:  Met with the patient, her husband, son Myna Bright who is the primary caregiver, he lives with patient and her husband. Also met with daughter and son. Patient's son Myna Bright recalls his conversations with Dr Adair Patter. He recaps that the patient has an incurable illness, she has had  ongoing gradual progressive decline from dementia, recurrent PNA, ongoing weakness, now bed bound, does not eat much, is largely non verbal.   PLAN: Son does not want patient to go to SNF Son plans on taking patient home He is agreeable to home with hospice on discharge We discussed extensively about differences between home health care and home with hospice, he is agreeable to hospice services at home.  Will request case manager consult for home with hospice on discharge.  Continue current mode of care.  Encourage PO  Code Status:    Code Status Orders        Start     Ordered   03/20/16 2150  Do not attempt resuscitation (DNR)  Continuous    Question Answer Comment  In the event of cardiac or respiratory ARREST Do not call a "code blue"   In the event of cardiac or respiratory ARREST Do not perform Intubation, CPR, defibrillation or ACLS   In the event of cardiac or respiratory ARREST Use medication by any route, position, wound care, and other measures to relive pain and suffering. May use oxygen, suction and manual treatment of airway obstruction as needed for comfort.      03/20/16 2152    Code Status History    Date Active Date Inactive Code Status Order ID Comments User Context   03/02/2016  7:40 AM 03/06/2016 10:04 PM Partial Code [id number]  Radene Gunning, NP ED   03/02/2016  7:20 AM 03/02/2016  7:40 AM Full Code [id number]  Radene Gunning, NP ED   07/14/2015 12:40 PM 07/18/2015  2:30 PM Full Code [id number]  Sid Falcon, MD Inpatient    Advance Directive Documentation   Flowsheet Row Most Recent Value  Type of Advance Directive  -- [pt has a most paper, son states she is  to be resuscitated]  Pre-existing out of facility DNR order (yellow form or pink MOST form)  No data  "MOST" Form in Place?  No data       Prognosis:   < 3 months  Discharge Planning:  Home with Hospice  Care plan was discussed with  Patient husband son daughter   Thank you for allowing the  Palliative Medicine Team to assist in the care of this patient.   Time In: 1600 Time Out: 1635 Total Time 35 Prolonged Time Billed  no       Greater than 50%  of this time was spent counseling and coordinating care related to the above assessment and plan.  Loistine Chance, MD [phone]  Please contact Palliative Medicine Team phone at [phone] for questions and concerns.

## 2016-03-22 NOTE — Progress Notes (Signed)
PROGRESS NOTE    Jamie Lester  [id number] DOB: 1928/07/09 DOA: 03/20/2016 PCP: Cammy Copa, MD    Brief Narrative:  Jamie Lester is a 82 y.o. female with medical history significant of HTN, Dementia and deteriorating status over past 6 months, currently at baseline it sounds like she hasnt walked in a couple of months, rarely speaks but hallucinates when she does, and has had poor PO intake and massive weight loss over last couple of months.  Patient was admitted earlier this month with LLL PNA, treated and discharged to NH.  At NH she has continued to do very poorly, worsening again about a week ago and no PO intake for past 4 days.  ED Course: LLL PNA, WBC up to 18k from 9k at discharge on the 10th.  Family present on 12/28 state patient has been declining recently and are wondering about palliative care and hospice.  Palliative Care consulted.    Assessment & Plan:   Principal Problem:   Left lower lobe pneumonia ([place]) Active Problems:   Dementia   AKI (acute kidney injury) ([place])   Essential hypertension   Acute encephalopathy   Adult failure to thrive   Decubitus ulcer of sacral region, stage 1   Encounter for palliative care  LLL PNA  -PNA pathway - Cefepime and vanc due to persistence / recurrence - family understands that this may treat infection but will not improve functional status  AKI - profoundly dehydrated patient due to no PO intake with sodium of 151 at admission - repeat sodium of 148 yesterday - repeat BMP ordered - IVF bolus and 125 cc/hr - will d/c blood draws if patient family opts for comfort based measures only  Acute encephalopathy - due to acute illness on top of baseline end-stage dementia  Adult failure to thrive and dementia  -  patient had been going down hill dramatically from functional standpoint - At baseline: she has minimal (and insufficient) PO intake, is bed bound and does not ambulate - Palliative Care  consulted- patient family seems ready for possibly transitioning to comfort measures only - palliative care to have family meeting today (pending patient's son [place])  Stage 1 sacral decubitus  - wound care consult ordered  RLE apparent tenderness on exam  - plain film X rays of hip, knee, ankle ordered.   DVT prophylaxis: Heparin [place] Code Status: DNR / DNI, discussed with family- would like to talk with palliative care Family Communication: no family at bedside at time of my exam Disposition: may d/c when medically stable or if patient family decides on hospice  Consultants:   Palliative Care  Procedures:   None  Antimicrobials:   Cefepime  Vancomycin    Subjective: Patient seen and evaluated- she tracks with her eyes but is nonverbal but today did nod her head yes.  Objective: Vitals:   03/21/16 0559 03/21/16 1430 03/21/16 2230 03/22/16 0447  BP: 139/89 119/70 (!) 146/75 (!) 144/88  Pulse: 88 60 66 (!) 56  Resp: 16 16 14 14   Temp: 98 F (36.7 C) 97.5 F (36.4 C) 97.8 F (36.6 C) 98.4 F (36.9 C)  TempSrc: Axillary Axillary Oral Axillary  SpO2: 96% 94% 97% 99%  Weight:      Height:       No intake or output data in the 24 hours ending 03/22/16 1555 Filed Weights   03/20/16 1727  Weight: 44.5 kg (98 lb)    Examination:  General exam: Appears calm, thin, frail  appearing female Respiratory system: Significant upper airway noises, rhonchi in middle lung fields Respiratory effort normal. Cardiovascular system: S1 & S2 heard, RRR. No JVD, murmurs, rubs, gallops or clicks. No pedal edema. Gastrointestinal system: Abdomen is nondistended, soft and nontender. No organomegaly or masses felt. Normal bowel sounds heard. Central nervous system: Alert. Extremities: unable to evaluate- able to move arms spontaneously bilaterally Skin: No rashes, lesions or ulcers Psychiatry: unable to evaluate.     Data Reviewed: I have personally reviewed following labs and  imaging studies  CBC:  Recent Labs Lab 03/20/16 1815 03/21/16 0418  WBC 18.6* 19.4*  NEUTROABS 16.8*  --   HGB 11.8* 11.1*  HCT 35.8* 32.8*  MCV 93.7 92.1  PLT 293 123456   Basic Metabolic Panel:  Recent Labs Lab 03/20/16 1815 03/21/16 0418  NA 151* 148*  K 4.3 3.9  CL 116* 117*  CO2 23 18*  GLUCOSE 115* 150*  BUN 65* 62*  CREATININE 1.29* 1.22*  CALCIUM 10.5* 9.7   GFR: Estimated Creatinine Clearance: 23.7 mL/min (by C-G formula based on SCr of 1.22 mg/dL (H)). Liver Function Tests:  Recent Labs Lab 03/20/16 1815  AST 90*  ALT 146*  ALKPHOS 80  BILITOT 0.4  PROT 7.2  ALBUMIN 2.9*   No results for input(s): LIPASE, AMYLASE in the last 168 hours. No results for input(s): AMMONIA in the last 168 hours. Coagulation Profile: No results for input(s): INR, PROTIME in the last 168 hours. Cardiac Enzymes: No results for input(s): CKTOTAL, CKMB, CKMBINDEX, TROPONINI in the last 168 hours. BNP (last 3 results) No results for input(s): PROBNP in the last 8760 hours. HbA1C: No results for input(s): HGBA1C in the last 72 hours. CBG: No results for input(s): GLUCAP in the last 168 hours. Lipid Profile: No results for input(s): CHOL, HDL, LDLCALC, TRIG, CHOLHDL, LDLDIRECT in the last 72 hours. Thyroid Function Tests: No results for input(s): TSH, T4TOTAL, FREET4, T3FREE, THYROIDAB in the last 72 hours. Anemia Panel: No results for input(s): VITAMINB12, FOLATE, FERRITIN, TIBC, IRON, RETICCTPCT in the last 72 hours. Sepsis Labs: No results for input(s): PROCALCITON, LATICACIDVEN in the last 168 hours.  Recent Results (from the past 240 hour(s))  Culture, blood (routine x 2) Call MD if unable to obtain prior to antibiotics being given     Status: None (Preliminary result)   Collection Time: 03/20/16 11:40 PM  Result Value Ref Range Status   Specimen Description BLOOD RIGHT ARM  Final   Special Requests BOTTLES DRAWN AEROBIC ONLY [place]  Final   Culture   Final    NO  GROWTH 1 DAY Performed at [facility]    Report Status PENDING  Incomplete  Culture, blood (routine x 2) Call MD if unable to obtain prior to antibiotics being given     Status: None (Preliminary result)   Collection Time: 03/20/16 11:40 PM  Result Value Ref Range Status   Specimen Description BLOOD RIGHT HAND  Final   Special Requests IN PEDIATRIC BOTTLE [place]  Final   Culture   Final    NO GROWTH 1 DAY Performed at [facility]    Report Status PENDING  Incomplete         Radiology Studies: Dg Chest 2 View  Result Date: 03/20/2016 CLINICAL DATA:  Altered mental status. EXAM: CHEST  2 VIEW COMPARISON:  03/02/2016 FINDINGS: Left base airspace disease again noted, slightly improved concerning for residual infiltrate/pneumonia. Right lung is clear. Heart is upper limits normal in size. No  visible effusions or acute bony abnormality. IMPRESSION: Continued left lower lobe airspace opacity, slightly improved since prior study concerning for residual pneumonia. Electronically Signed   By: Rolm Baptise M.D.   On: 03/20/2016 17:57   Dg Ankle Complete Right  Result Date: 03/20/2016 CLINICAL DATA:  Patient is non verbal, family states she has not had any known injury. Patient was brought in for a cough. When MD flexes patient's right ankle she flinches like she is in pain, checking for fracture. EXAM: RIGHT ANKLE - COMPLETE 3+ VIEW COMPARISON:  None. FINDINGS: Lateral view limited by positioning. There is no evidence of fracture, dislocation, or joint effusion. There is no evidence of arthropathy or other focal bone abnormality. Soft tissues are unremarkable. IMPRESSION: No acute abnormality of the right ankle. Electronically Signed   By: Jeb Levering M.D.   On: 03/20/2016 22:09   Dg Hip Unilat With Pelvis 1v Right  Result Date: 03/20/2016 CLINICAL DATA:  Right lower extremity pain EXAM: DG HIP (WITH OR WITHOUT PELVIS) 1V RIGHT COMPARISON:  None. FINDINGS: There is no  evidence of hip fracture or dislocation. There is no evidence of arthropathy or other focal bone abnormality. IMPRESSION: No right hip fracture or dislocation. Electronically Signed   By: Ulyses Jarred M.D.   On: 03/20/2016 22:08   Dg Knee 2 Views Right  Result Date: 03/20/2016 CLINICAL DATA:  Patient is non verbal, family states she has not had any known injury. Patient was brought in for a cough. When MD flexes patient's right ankle she flinches like she is in pain, checking for fracture. EXAM: RIGHT KNEE - 3 VIEW COMPARISON:  None. FINDINGS: No evidence of fracture, dislocation, or joint effusion. Mild tricompartmental osteoarthritis. Soft tissues are unremarkable. IMPRESSION: Mild osteoarthritis.  No evidence of acute abnormality. Electronically Signed   By: Jeb Levering M.D.   On: 03/20/2016 22:08        Scheduled Meds: . ceFEPime (MAXIPIME) IV  1 g Intravenous Q24H  . heparin  5,000 Units Subcutaneous Q8H  . vancomycin  500 mg Intravenous Q24H   Continuous Infusions: . sodium chloride 125 mL/hr at 03/21/16 1447     LOS: 2 days    Time spent: 30 minutes    Loretha Stapler, MD [facility] Pager [phone]  If 7PM-7AM, please contact night-coverage [email or web address] Password [email or web address] 03/22/2016, 3:55 PM

## 2016-03-23 DIAGNOSIS — Z7189 Other specified counseling: Secondary | ICD-10-CM

## 2016-03-23 LAB — GLUCOSE, CAPILLARY: Glucose-Capillary: 97 mg/dL (ref 65–99)

## 2016-03-23 MED ORDER — DEXTROSE 5 % IV SOLN
INTRAVENOUS | Status: DC
  Administered 2016-03-23: 1 mL via INTRAVENOUS
  Administered 2016-03-24 – 2016-03-25 (×4): via INTRAVENOUS

## 2016-03-23 MED ORDER — LIP MEDEX EX OINT
TOPICAL_OINTMENT | CUTANEOUS | Status: AC
  Administered 2016-03-23: 1
  Filled 2016-03-23: qty 7

## 2016-03-23 NOTE — Progress Notes (Signed)
PROGRESS NOTE    [name]  [id number] DOB: Dec 08, 1928 DOA: 03/20/2016 PCP: Cammy Copa, MD    Brief Narrative:  Jamie Lester is a 82 y.o. female with medical history significant of HTN, Dementia and deteriorating status over past 6 months, currently at baseline it sounds like she hasnt walked in a couple of months, rarely speaks but hallucinates when she does, and has had poor PO intake and massive weight loss over last couple of months.  Patient was admitted earlier this month with LLL PNA, treated and discharged to NH.  At NH she has continued to do very poorly, worsening again about a week ago and no PO intake for past 4 days.  Family present on 12/28 stated patient has been declining recently and are wondering about palliative care and hospice.  Palliative Care consulted. Family thinking of residential hospice. They will let [place] know of final decision.     Assessment & Plan:   Principal Problem:   Left lower lobe pneumonia ([place]) Active Problems:   Dementia   AKI (acute kidney injury) ([place])   Essential hypertension   Acute encephalopathy   Adult failure to thrive   Decubitus ulcer of sacral region, stage 1   Encounter for palliative care   Goals of care, counseling/discussion  LLL PNA  -PNA pathway - Cefepime and vanc due to persistence / recurrence - family understands that this may treat infection but will not improve functional status - d/c antibiotics on discharge to residential hospice.   AKI - profoundly dehydrated patient due to no PO intake with sodium of 151 at admission - would recommend stopping the blood draws if family is opting for residential hospice.  - worsening. Changed NS fluids to dextrose fluids.   Acute encephalopathy - due to acute illness on top of baseline end-stage dementia  Adult failure to thrive and dementia  -  patient had been going down hill dramatically from functional standpoint - At baseline: she has  minimal (and insufficient) PO intake, is bed bound and does not ambulate - Palliative Care consulted- patient family seems ready for possibly transitioning to comfort measures only - palliative care to have family meeting today (pending patient's son [place])  Stage 1 sacral decubitus  - wound care consult ordered and recommendations given.   RLE apparent tenderness on exam  - plain film X rays of hip, knee, ankle ordered, no fractures seen.    DVT prophylaxis: Heparin [place] Code Status: DNR / DNI,. Family Communication: no family at bedside at time of my exam Disposition: may d/c when medically stable or if patient family decides on hospice  Consultants:   Palliative Care  Procedures:   None  Antimicrobials:   Cefepime  Vancomycin    Subjective: Patient seen and evaluated- non verbal.   Objective: Vitals:   03/22/16 1450 03/22/16 2212 03/23/16 0655 03/23/16 1353  BP: (!) 142/68 (!) 152/83 (!) 158/68 (!) 171/77  Pulse: 60 72 78 85  Resp: 16 20 20 20   Temp: 98 F (36.7 C) 97.9 F (36.6 C) 98.9 F (37.2 C) 97.8 F (36.6 C)  TempSrc: Axillary Axillary Axillary Axillary  SpO2: 99% 100% 100% 93%  Weight:      Height:        Intake/Output Summary (Last 24 hours) at 03/23/16 1803 Last data filed at 03/23/16 1400  Gross per 24 hour  Intake             1000 ml  Output  0 ml  Net             1000 ml   Filed Weights   03/20/16 1727  Weight: 44.5 kg (98 lb)    Examination:  General exam: Appears calm, thin, frail appearing female Respiratory system: Significant upper airway noises, rhonchi in middle lung fields Respiratory effort normal. Cardiovascular system: S1 & S2 heard, RRR. No JVD, murmurs, rubs, gallops or clicks. No pedal edema. Gastrointestinal system: Abdomen is nondistended, soft and nontender. No organomegaly or masses felt. Normal bowel sounds heard. Central nervous system: Alert. Extremities: unable to evaluate- able to move arms  spontaneously bilaterally Skin: No rashes, lesions or ulcers Psychiatry: unable to evaluate.     Data Reviewed: I have personally reviewed following labs and imaging studies  CBC:  Recent Labs Lab 03/20/16 1815 03/21/16 0418  WBC 18.6* 19.4*  NEUTROABS 16.8*  --   HGB 11.8* 11.1*  HCT 35.8* 32.8*  MCV 93.7 92.1  PLT 293 123456   Basic Metabolic Panel:  Recent Labs Lab 03/20/16 1815 03/21/16 0418 03/22/16 1801  NA 151* 148* 152*  K 4.3 3.9 3.4*  CL 116* 117* 123*  CO2 23 18* 16*  GLUCOSE 115* 150* 94  BUN 65* 62* 77*  CREATININE 1.29* 1.22* 1.00  CALCIUM 10.5* 9.7 8.8*   GFR: Estimated Creatinine Clearance: 28.9 mL/min (by C-G formula based on SCr of 1 mg/dL). Liver Function Tests:  Recent Labs Lab 03/20/16 1815  AST 90*  ALT 146*  ALKPHOS 80  BILITOT 0.4  PROT 7.2  ALBUMIN 2.9*   No results for input(s): LIPASE, AMYLASE in the last 168 hours. No results for input(s): AMMONIA in the last 168 hours. Coagulation Profile: No results for input(s): INR, PROTIME in the last 168 hours. Cardiac Enzymes: No results for input(s): CKTOTAL, CKMB, CKMBINDEX, TROPONINI in the last 168 hours. BNP (last 3 results) No results for input(s): PROBNP in the last 8760 hours. HbA1C: No results for input(s): HGBA1C in the last 72 hours. CBG: No results for input(s): GLUCAP in the last 168 hours. Lipid Profile: No results for input(s): CHOL, HDL, LDLCALC, TRIG, CHOLHDL, LDLDIRECT in the last 72 hours. Thyroid Function Tests: No results for input(s): TSH, T4TOTAL, FREET4, T3FREE, THYROIDAB in the last 72 hours. Anemia Panel: No results for input(s): VITAMINB12, FOLATE, FERRITIN, TIBC, IRON, RETICCTPCT in the last 72 hours. Sepsis Labs: No results for input(s): PROCALCITON, LATICACIDVEN in the last 168 hours.  Recent Results (from the past 240 hour(s))  Culture, blood (routine x 2) Call MD if unable to obtain prior to antibiotics being given     Status: None (Preliminary  result)   Collection Time: 03/20/16 11:40 PM  Result Value Ref Range Status   Specimen Description BLOOD RIGHT ARM  Final   Special Requests BOTTLES DRAWN AEROBIC ONLY [place]  Final   Culture   Final    NO GROWTH 2 DAYS Performed at [facility]    Report Status PENDING  Incomplete  Culture, blood (routine x 2) Call MD if unable to obtain prior to antibiotics being given     Status: None (Preliminary result)   Collection Time: 03/20/16 11:40 PM  Result Value Ref Range Status   Specimen Description BLOOD RIGHT HAND  Final   Special Requests IN PEDIATRIC BOTTLE [place]  Final   Culture   Final    NO GROWTH 2 DAYS Performed at [facility]    Report Status PENDING  Incomplete  Radiology Studies: No results found.      Scheduled Meds: . ceFEPime (MAXIPIME) IV  1 g Intravenous Q24H  . feeding supplement (ENSURE ENLIVE)  237 mL Oral TID BM  . heparin  5,000 Units Subcutaneous Q8H  . multivitamin with minerals  1 tablet Oral Daily  . vancomycin  500 mg Intravenous Q24H   Continuous Infusions: . sodium chloride 125 mL/hr at 03/23/16 1311     LOS: 3 days    Time spent: 15 minutes    Roshun Klingensmith, MD [facility] Pager [phone]  If 7PM-7AM, please contact night-coverage [email or web address] Password [email or web address] 03/23/2016, 6:03 PM

## 2016-03-23 NOTE — Progress Notes (Signed)
Daily Progress Note   Patient Name: Jamie Lester       Date: 03/23/2016 DOB: 06-Jun-1928  Age: 82 y.o. MRN#: [id number] Attending Physician: Hosie Poisson, MD Primary Care Physician: Cammy Copa, MD Admit Date: 03/20/2016  Reason for Consultation/Follow-up: Establishing goals of care  Subjective:  patient is resting in bed No acute complaints No family at bedside See family meeting note below:  Length of Stay: 3  Current Medications: Scheduled Meds:  . ceFEPime (MAXIPIME) IV  1 g Intravenous Q24H  . feeding supplement (ENSURE ENLIVE)  237 mL Oral TID BM  . heparin  5,000 Units Subcutaneous Q8H  . multivitamin with minerals  1 tablet Oral Daily  . vancomycin  500 mg Intravenous Q24H    Continuous Infusions: . sodium chloride 125 mL/hr at 03/23/16 1311    PRN Meds: albuterol  Physical Exam         Awake alert S1 S2 Coarse breath sounds anteriorly Abdomen soft No edema Dementia No agitation   Vital Signs: BP (!) 171/77 (BP Location: Right Arm) Comment: RN notified  Pulse 85   Temp 97.8 F (36.6 C) (Axillary)   Resp 20   Ht 5\' 2"  (1.575 m)   Wt 44.5 kg (98 lb)   SpO2 93%   BMI 17.92 kg/m  SpO2: SpO2: 93 % O2 Device: O2 Device: Not Delivered O2 Flow Rate:    Intake/output summary:   Intake/Output Summary (Last 24 hours) at 03/23/16 1452 Last data filed at 03/23/16 1000  Gross per 24 hour  Intake              500 ml  Output                0 ml  Net              500 ml   LBM: Last BM Date: 03/22/16 Baseline Weight: Weight: 44.5 kg (98 lb) Most recent weight: Weight:  (pt. is nonverbal and no bedscale...family not available)       Palliative Assessment/Data:    Flowsheet Rows   Flowsheet Row Most Recent Value  Intake Tab  Referral  Department  Hospitalist  Unit at Time of Referral  Med/Surg Unit  Palliative Care Primary Diagnosis  Other (Comment)  Palliative Care Type  Return patient Palliative Care  Reason for referral  Clarify Goals of Care  Date first seen by Palliative Care  03/21/16  Clinical Assessment  Palliative Performance Scale Score  30%  Pain Max last 24 hours  4  Pain Min Last 24 hours  3  Dyspnea Max Last 24 Hours  4  Dyspnea Min Last 24 hours  3  Nausea Max Last 24 Hours  4  Nausea Min Last 24 Hours  3  Psychosocial & Spiritual Assessment  Palliative Care Outcomes  Patient/Family meeting held?  Yes  Who was at the meeting?  patient husband 2 sons 1 daughter   Palliative Care Outcomes  Clarified goals of care      Patient Active Problem List   Diagnosis Date Noted  . Encounter for palliative care   . Left lower lobe pneumonia ([name]) 03/20/2016  . Adult failure to thrive 03/20/2016  . Decubitus ulcer of sacral region, stage 1 03/20/2016  . Acute encephalopathy 03/02/2016  . PNA (pneumonia) 03/02/2016  . Elevated troponin 03/02/2016  . Generalized weakness 03/02/2016  . Elevated transaminase level 03/02/2016  . Protein-calorie malnutrition, severe 03/02/2016  . Liver disease   . CKD (chronic kidney disease)   . Anemia of chronic disease 10/19/2015  . Acute lower GI bleeding 07/14/2015  . AKI (acute kidney injury) ([place]) 07/14/2015  . Dementia   . Hypertension   . Hypercholesteremia   . Acute blood loss anemia   . Essential hypertension     Palliative Care Assessment & Plan   Patient Profile:    Assessment:  Adult FTT Dementia Acute encephalopathy- resolving Acute kidney injury Not eating Bed bound status Recurrent PNA Stage 1 sacral decub, present on admit   Recommendations/Plan: Received call from patient's son Elberta Fortis, he states that the patient's primary caregiver son Myna Bright has had heart attacks recently and is likely not able to care for the patient in the home  setting, even with hospice support.  He then asked about residential hospice, we discussed that residential hospice will keep patient comfortable, but not provide therapies or antibiotics. Also discussed about skilled nursing facilities and home with hospice.   Family will think about most appropriate D/C option and notify us.    Code Status:    Code Status Orders        Start     Ordered   03/20/16 2150  Do not attempt resuscitation (DNR)  Continuous    Question Answer Comment  In the event of cardiac or respiratory ARREST Do not call a "code blue"   In the event of cardiac or respiratory ARREST Do not perform Intubation, CPR, defibrillation or ACLS   In the event of cardiac or respiratory ARREST Use medication by any route, position, wound care, and other measures to relive pain and suffering. May use oxygen, suction and manual treatment of airway obstruction as needed for comfort.      03/20/16 2152    Code Status History    Date Active Date Inactive Code Status Order ID Comments User Context   03/02/2016  7:40 AM 03/06/2016 10:04 PM Partial Code [id number]  Radene Gunning, NP ED   03/02/2016  7:20 AM 03/02/2016  7:40 AM Full Code [id number]  Radene Gunning, NP ED   07/14/2015 12:40 PM 07/18/2015  2:30 PM Full Code [id number]  Sid Falcon, MD Inpatient    Advance Directive Documentation   Flowsheet Row Most Recent Value  Type of Advance Directive  -- [pt has a most paper, son states  she is to be resuscitated]  Pre-existing out of facility DNR order (yellow form or pink MOST form)  No data  "MOST" Form in Place?  No data       Prognosis:   ?weeks  Discharge Planning:  Re discussed with son and daughter: they would like more information about residential hospice versus SNF versus home with hospice.   Care plan was discussed with  Patient's son daughter over the phone.   Thank you for allowing the Palliative Medicine Team to assist in the care of this patient.   Time In: 10  Time Out: 10.25 Total Time 25 Prolonged Time Billed  no       Greater than 50%  of this time was spent counseling and coordinating care related to the above assessment and plan.  Loistine Chance, MD [phone]  Please contact Palliative Medicine Team phone at [phone] for questions and concerns.

## 2016-03-23 NOTE — Progress Notes (Addendum)
Pharmacy Antibiotic Note  Jamie Lester is a 82 y.o. female admitted on 03/20/2016 with pneumonia.  Pharmacy has been consulted for Vancomycin and Cefepime dosing.  SCr improved, CrCl~ 28 ml/min. Day #3 vancomycin and cefepime.  MD wishes to continue both antibiotics since pneumonia is recurrent.  Plan: Vancomycin 500 mg IV q24h. Will check trough level tomorrow if continued. Continue cefepime 1g IV q24h.  Height:  (pt. is nonverbal and no bedscale...family not available) Weight:  (pt. is nonverbal and no bedscale...family not available) IBW/kg (Calculated) : 50.1  Temp (24hrs), Avg:98.3 F (36.8 C), Min:97.9 F (36.6 C), Max:98.9 F (37.2 C)   Recent Labs Lab 03/20/16 1815 03/21/16 0418 03/22/16 1801  WBC 18.6* 19.4*  --   CREATININE 1.29* 1.22* 1.00    Estimated Creatinine Clearance: 28.9 mL/min (by C-G formula based on SCr of 1 mg/dL).    No Known Allergies  Antimicrobials this admission: 12/27 Vancomycin >>  12/27 Cefepime >>   Dose adjustments this admission:  12/31 2100 VT: ____ on 500 mg IV q24h  Microbiology results:  12/27 BCx: ngtd 12/27 Urine strep: neg 12/27 HIV Ab: NR  Thank you for allowing pharmacy to be a part of this patient's care.  Hershal Coria 03/23/2016 12:01 PM

## 2016-03-24 LAB — VANCOMYCIN, TROUGH: Vancomycin Tr: 8 ug/mL — ABNORMAL LOW (ref 15–20)

## 2016-03-24 MED ORDER — VANCOMYCIN HCL IN DEXTROSE 750-5 MG/150ML-% IV SOLN
750.0000 mg | INTRAVENOUS | Status: DC
  Administered 2016-03-24 – 2016-03-26 (×3): 750 mg via INTRAVENOUS
  Filled 2016-03-24 (×3): qty 150

## 2016-03-24 NOTE — Progress Notes (Signed)
Pharmacy Antibiotic Note  Jamie Lester is a 82 y.o. female admitted on 03/20/2016 with pneumonia.  Pharmacy has been consulted for Vancomycin and Cefepime dosing.  SCr improved, CrCl~ 28 ml/min. Day #4 vancomycin and cefepime.  MD wishes to continue both antibiotics since pneumonia is recurrent.  Plan: Increase Vancomycin 750 mg IV q24h.  Continue cefepime 1g IV q24h.  Height:  (pt. is nonverbal and no bedscale...family not available) Weight:  (pt. is nonverbal and no bedscale...family not available) IBW/kg (Calculated) : 50.1  Temp (24hrs), Avg:98.7 F (37.1 C), Min:97.8 F (36.6 C), Max:99.5 F (37.5 C)   Recent Labs Lab 03/20/16 1815 03/21/16 0418 03/22/16 1801 03/24/16 0007  WBC 18.6* 19.4*  --   --   CREATININE 1.29* 1.22* 1.00  --   VANCOTROUGH  --   --   --  8*    Estimated Creatinine Clearance: 28.9 mL/min (by C-G formula based on SCr of 1 mg/dL).    No Known Allergies  Antimicrobials this admission: 12/27 Vancomycin >>  12/27 Cefepime >>   Dose adjustments this admission:  12/31 00:41 VT: 8 mcg/ml on 500 mg IV q24h ==> inc to 750mg  IV q24h  Microbiology results:  12/27 BCx: ngtd 12/27 Urine strep: neg 12/27 HIV Ab: NR  Thank you for allowing pharmacy to be a part of this patient's care.  Everette Rank, PharmD 03/24/2016 1:07 AM

## 2016-03-24 NOTE — Progress Notes (Signed)
PROGRESS NOTE    Jamie Lester  [id number] DOB: 1928/09/16 DOA: 03/20/2016 PCP: Cammy Copa, MD    Brief Narrative:  Jamie Lester is a 82 y.o. female with medical history significant of HTN, Dementia and deteriorating status over past 6 months, currently at baseline it sounds like she hasnt walked in a couple of months, rarely speaks but hallucinates when she does, and has had poor PO intake and massive weight loss over last couple of months.  Patient was admitted earlier this month with LLL PNA, treated and discharged to NH.  At NH she has continued to do very poorly, worsening again about a week ago and no PO intake for past 4 days.  Family understands that patient has been declining recently and are wondering about palliative care and hospice.  Palliative Care consulted. Family thinking of residential hospice. They will let [place] know of final decision.     Assessment & Plan:   Principal Problem:   Left lower lobe pneumonia ([place]) Active Problems:   Dementia   AKI (acute kidney injury) ([place])   Essential hypertension   Acute encephalopathy   Adult failure to thrive   Decubitus ulcer of sacral region, stage 1   Encounter for palliative care   Goals of care, counseling/discussion  LLL PNA  -PNA pathway - Cefepime and vanc due to persistence / recurrence.  - family understands that this may treat infection but will not improve functional status - d/c antibiotics on discharge to residential hospice.   AKI - profoundly dehydrated patient due to no PO intake with sodium of 151 at admission - would recommend stopping the blood draws if family is opting for residential hospice.  - worsening. Changed NS fluids to dextrose fluids.   Acute encephalopathy - due to acute illness on top of baseline end-stage dementia and acute renal failure and hypernatremia.   Adult failure to thrive and dementia  -  patient had been going down hill dramatically from functional  standpoint - At baseline: she has minimal (and insufficient) PO intake, is bed bound and does not ambulate - Palliative Care consulted- patient family seems ready for possibly transitioning to comfort measures only - palliative care had a meeting with son on 12/30 and are thinking of residential hospice.   Stage 1 sacral decubitus  - wound care consult ordered and recommendations given.   RLE apparent tenderness on exam  - plain film X rays of hip, knee, ankle ordered, no fractures seen.    DVT prophylaxis: Heparin [place] Code Status: DNR / DNI,. Family Communication: no family at bedside at time of my exam Disposition: may d/c when family decides on residential hospice.   Consultants:   Palliative Care  Procedures:   None  Antimicrobials:   Cefepime  Vancomycin    Subjective: Patient seen and evaluated- non verbal.   Objective: Vitals:   03/23/16 0655 03/23/16 1353 03/23/16 2200 03/24/16 0704  BP: (!) 158/68 (!) 171/77 (!) 165/73 (!) 149/63  Pulse: 78 85 70 (!) 57  Resp: 20 20 20 20   Temp: 98.9 F (37.2 C) 97.8 F (36.6 C) 99.5 F (37.5 C) 98.8 F (37.1 C)  TempSrc: Axillary Axillary Axillary Axillary  SpO2: 100% 93% 98% 100%  Weight:      Height:        Intake/Output Summary (Last 24 hours) at 03/24/16 1015 Last data filed at 03/24/16 0600  Gross per 24 hour  Intake          2168.33  ml  Output                0 ml  Net          2168.33 ml   Filed Weights   03/20/16 1727  Weight: 44.5 kg (98 lb)    Examination:  General exam: Appears calm, thin, frail appearing female Respiratory system:  [place] air entry, scattered rhonchi. Respiratory effort normal. Cardiovascular system: S1 & S2 heard, RRR. No JVD, murmurs, rubs, gallops or clicks. No pedal edema. Gastrointestinal system: Abdomen is nondistended, soft and nontender. No organomegaly or masses felt. Normal bowel sounds heard. Central nervous system: Alert. Extremities: unable to evaluate- able to  move arms spontaneously bilaterally Skin: No rashes, lesions or ulcers Psychiatry: unable to evaluate.     Data Reviewed: I have personally reviewed following labs and imaging studies  CBC:  Recent Labs Lab 03/20/16 1815 03/21/16 0418  WBC 18.6* 19.4*  NEUTROABS 16.8*  --   HGB 11.8* 11.1*  HCT 35.8* 32.8*  MCV 93.7 92.1  PLT 293 123456   Basic Metabolic Panel:  Recent Labs Lab 03/20/16 1815 03/21/16 0418 03/22/16 1801  NA 151* 148* 152*  K 4.3 3.9 3.4*  CL 116* 117* 123*  CO2 23 18* 16*  GLUCOSE 115* 150* 94  BUN 65* 62* 77*  CREATININE 1.29* 1.22* 1.00  CALCIUM 10.5* 9.7 8.8*   GFR: Estimated Creatinine Clearance: 28.9 mL/min (by C-G formula based on SCr of 1 mg/dL). Liver Function Tests:  Recent Labs Lab 03/20/16 1815  AST 90*  ALT 146*  ALKPHOS 80  BILITOT 0.4  PROT 7.2  ALBUMIN 2.9*   No results for input(s): LIPASE, AMYLASE in the last 168 hours. No results for input(s): AMMONIA in the last 168 hours. Coagulation Profile: No results for input(s): INR, PROTIME in the last 168 hours. Cardiac Enzymes: No results for input(s): CKTOTAL, CKMB, CKMBINDEX, TROPONINI in the last 168 hours. BNP (last 3 results) No results for input(s): PROBNP in the last 8760 hours. HbA1C: No results for input(s): HGBA1C in the last 72 hours. CBG:  Recent Labs Lab 03/23/16 2202  GLUCAP 97   Lipid Profile: No results for input(s): CHOL, HDL, LDLCALC, TRIG, CHOLHDL, LDLDIRECT in the last 72 hours. Thyroid Function Tests: No results for input(s): TSH, T4TOTAL, FREET4, T3FREE, THYROIDAB in the last 72 hours. Anemia Panel: No results for input(s): VITAMINB12, FOLATE, FERRITIN, TIBC, IRON, RETICCTPCT in the last 72 hours. Sepsis Labs: No results for input(s): PROCALCITON, LATICACIDVEN in the last 168 hours.  Recent Results (from the past 240 hour(s))  Culture, blood (routine x 2) Call MD if unable to obtain prior to antibiotics being given     Status: None (Preliminary  result)   Collection Time: 03/20/16 11:40 PM  Result Value Ref Range Status   Specimen Description BLOOD RIGHT ARM  Final   Special Requests BOTTLES DRAWN AEROBIC ONLY [place]  Final   Culture   Final    NO GROWTH 2 DAYS Performed at [facility]    Report Status PENDING  Incomplete  Culture, blood (routine x 2) Call MD if unable to obtain prior to antibiotics being given     Status: None (Preliminary result)   Collection Time: 03/20/16 11:40 PM  Result Value Ref Range Status   Specimen Description BLOOD RIGHT HAND  Final   Special Requests IN PEDIATRIC BOTTLE [facility]  Final   Culture   Final    NO GROWTH 2 DAYS Performed at [facility]  Report Status PENDING  Incomplete         Radiology Studies: No results found.      Scheduled Meds: . ceFEPime (MAXIPIME) IV  1 g Intravenous Q24H  . feeding supplement (ENSURE ENLIVE)  237 mL Oral TID BM  . heparin  5,000 Units Subcutaneous Q8H  . multivitamin with minerals  1 tablet Oral Daily  . vancomycin  750 mg Intravenous Q24H   Continuous Infusions: . dextrose 1 mL (03/23/16 2219)     LOS: 4 days    Time spent: 15 minutes    Shadi Sessler, MD [facility] Pager [phone]  If 7PM-7AM, please contact night-coverage [email or web address] Password [email or web address] 03/24/2016, 10:15 AM

## 2016-03-24 NOTE — Clinical Social Work Note (Signed)
CSW was notified by MD that family is requesting residential hospice placement at this time. The patient was admitted from [facility] but the family feels residential hospice placement is most appropriate at this time. CSW provided the son and husband with hospice information and made referral to [facility] as the son and husband only want [facility]. Family understands that we cannot guarantee a bed at [facility] and that other options including home will have to be pursued if [facility] cannot admit the patient. Handoff left for weekday CSW.  Liz Beach MSW, [place], [place], JI:[id number]

## 2016-03-25 NOTE — Progress Notes (Signed)
[facility] Liaison Visit  Met with patient's son Mahari Vankirk and husband at bedside.  Reviewed hospice services and son appreciative for bed offer.  Notified Roselyn Reef SW that [employer] able to accommodate patient admission for tomorrow.  Thank you for this opportunity to serve, Moss Mc, RN , BSN, [facility] [place] liaisons are now on [place].  Feel free to reach out to me today directly at [phone] or call our main number at [phone].

## 2016-03-25 NOTE — Discharge Summary (Addendum)
Physician Discharge Summary  Jamie Lester [id number] DOB: [birth year] DOA: 03/20/2016  PCP: Cammy Copa, MD  Admit date: 03/20/2016 Discharge date: 03/26/2016  Recommendations for Outpatient Follow-up:  1. Pt will be discharged to residential hospice when bed available   Discharge Diagnoses:  Principal Problem:   Left lower lobe pneumonia ([place]) Active Problems:   Dementia   AKI (acute kidney injury) ([place])   Essential hypertension   Acute encephalopathy   Adult failure to thrive   Decubitus ulcer of sacral region, stage 1   Encounter for palliative care   Goals of care, counseling/discussion   Discharge Condition: Stable  Diet recommendation: Comfort feeding as pt able to tolerate   Brief Narrative:  83 y.o.femalewith medical history significant of HTN, Dementia and deteriorating status over past 6 months, currently at baseline it sounds like she hasnt walked in a couple of months, rarely speaks but hallucinates when she does, and has had poor PO intake and massive weight loss over last couple of months.  Patient was admitted earlier this month with LLL PNA, treated and discharged to NH. At NH she has continued to do very poorly, worsening again about a week prior to the admission and no PO intake.  Assessment & Plan:  LLL PNA  - started on Cefepime and vanc due to persistence / recurrence - family understands that this may treat infection but will not improve functional status - d/c antibiotics on discharge to residential hospice  AKI  - profoundly dehydrated patient due to no PO intake with sodium of 151 at admission - no further blood work to ensure comfort   Acute metabolic encephalopathy  - due to acute illness on top of baseline end-stage dementia and acute renal failure and hypernatremia - remains non verbal   Adult failure to thrive and dementia, functional quadriplegia  - patient had been declining dramatically from functional  standpoint - At baseline: she has minimal (and insufficient) PO intake, is bed bound and does not ambulate - Palliative Care consulted - patient family agreeable with residential hospice  Stage 1 sacral decubitus  - wound care consult ordered and recommendations given.   RLE pain  - plain film X rays of hip, knee, ankle ordered, no fractures seen.   Underweight, severe PCM - Body mass index is 17.92 kg/m.  DVT prophylaxis:Heparin SQ Code Status:DNR / DNI, gold form filled out  Family Communication:no family at bedside Disposition: reidential hospice   Consultants:   Palliative Care  Procedures:   None  Procedures/Studies: Dg Chest 2 View  Result Date: 03/20/2016 CLINICAL DATA:  Altered mental status. EXAM: CHEST  2 VIEW COMPARISON:  03/02/2016 FINDINGS: Left base airspace disease again noted, slightly improved concerning for residual infiltrate/pneumonia. Right lung is clear. Heart is upper limits normal in size. No visible effusions or acute bony abnormality. IMPRESSION: Continued left lower lobe airspace opacity, slightly improved since prior study concerning for residual pneumonia. Electronically Signed   By: Rolm Baptise M.D.   On: 03/20/2016 17:57   Dg Ankle Complete Right  Result Date: 03/20/2016 CLINICAL DATA:  Patient is non verbal, family states she has not had any known injury. Patient was brought in for a cough. When MD flexes patient's right ankle she flinches like she is in pain, checking for fracture. EXAM: RIGHT ANKLE - COMPLETE 3+ VIEW COMPARISON:  None. FINDINGS: Lateral view limited by positioning. There is no evidence of fracture, dislocation, or joint effusion. There is no evidence of arthropathy or other focal  bone abnormality. Soft tissues are unremarkable. IMPRESSION: No acute abnormality of the right ankle. Electronically Signed   By: Jeb Levering M.D.   On: 03/20/2016 22:09   Ct Head Wo Contrast  Result Date: 03/02/2016 CLINICAL DATA:   Increased lethargy and weakness for 2 days. EXAM: CT HEAD WITHOUT CONTRAST TECHNIQUE: Contiguous axial images were obtained from the base of the skull through the vertex without intravenous contrast. COMPARISON:  07/14/2015 FINDINGS: Brain: Stable marked brain atrophy and chronic white matter microvascular ischemic changes throughout the cerebral hemispheres. No acute intracranial hemorrhage, definite new infarction, mass lesion, midline shift, herniation, hydrocephalus, or extra-axial fluid collection. No focal mass effect or edema. Cisterns are patent. Cerebellar atrophy as well. Vascular: No hyperdense vessel or unexpected calcification. Skull: Normal. Negative for fracture or focal lesion. Sinuses/Orbits: No acute finding. Other: None. IMPRESSION: Stable chronic atrophy and microvascular white matter ischemic changes. No interval change or acute process by noncontrast CT. Electronically Signed   By: Jerilynn Mages.  Shick M.D.   On: 03/02/2016 12:30   Dg Chest Portable 1 View  Result Date: 03/02/2016 CLINICAL DATA:  Cough and lethargy for 2 days. EXAM: PORTABLE CHEST 1 VIEW COMPARISON:  None. FINDINGS: There is consolidation in the left base. There is moderate cardiomegaly. No large effusion. Right lung is clear. Pulmonary vasculature is normal. Hilar and mediastinal contours are unremarkable. IMPRESSION: Left base consolidation, suspicious for pneumonia. Followup PA and lateral chest X-ray is recommended in 3-4 weeks following trial of antibiotic therapy to ensure resolution and exclude underlying malignancy. Electronically Signed   By: Andreas Newport M.D.   On: 03/02/2016 06:29   Dg Hip Unilat With Pelvis 1v Right  Result Date: 03/20/2016 CLINICAL DATA:  Right lower extremity pain EXAM: DG HIP (WITH OR WITHOUT PELVIS) 1V RIGHT COMPARISON:  None. FINDINGS: There is no evidence of hip fracture or dislocation. There is no evidence of arthropathy or other focal bone abnormality. IMPRESSION: No right hip fracture or  dislocation. Electronically Signed   By: Ulyses Jarred M.D.   On: 03/20/2016 22:08   Dg Knee 2 Views Right  Result Date: 03/20/2016 CLINICAL DATA:  Patient is non verbal, family states she has not had any known injury. Patient was brought in for a cough. When MD flexes patient's right ankle she flinches like she is in pain, checking for fracture. EXAM: RIGHT KNEE - 3 VIEW COMPARISON:  None. FINDINGS: No evidence of fracture, dislocation, or joint effusion. Mild tricompartmental osteoarthritis. Soft tissues are unremarkable. IMPRESSION: Mild osteoarthritis.  No evidence of acute abnormality. Electronically Signed   By: Jeb Levering M.D.   On: 03/20/2016 22:08     Discharge Exam: Vitals:   03/24/16 2046 03/25/16 0545  BP: (!) 148/66 (!) 135/52  Pulse: 62 (!) 51  Resp: 18 16  Temp: 98.5 F (36.9 C) 97.8 F (36.6 C)   Vitals:   03/24/16 0704 03/24/16 1254 03/24/16 2046 03/25/16 0545  BP: (!) 149/63 (!) 153/62 (!) 148/66 (!) 135/52  Pulse: (!) 57 (!) 50 62 (!) 51  Resp: 20 18 18 16   Temp: 98.8 F (37.1 C) 98.9 F (37.2 C) 98.5 F (36.9 C) 97.8 F (36.6 C)  TempSrc: Axillary Axillary Axillary Axillary  SpO2: 100% 100% 100% 98%  Weight:      Height:       General: Pt is calm, NAD Cardiovascular: Regular rate and rhythm, S1/S2 +, no rubs, no gallops Respiratory: diminished breath sounds at bases   Discharge Instructions   Allergies as  of 03/25/2016   No Known Allergies     Medication List    STOP taking these medications   amLODipine 2.5 MG tablet Commonly known as:  NORVASC   aspirin EC 81 MG tablet   cefTRIAXone 1 g injection Commonly known as:  ROCEPHIN     TAKE these medications   albuterol (2.5 MG/3ML) 0.083% nebulizer solution Commonly known as:  PROVENTIL Take 3 mLs (2.5 mg total) by nebulization every 6 (six) hours as needed for wheezing or shortness of breath.   feeding supplement (ENSURE ENLIVE) Liqd Take 237 mLs by mouth 2 (two) times daily between  meals.   senna-docusate 8.6-50 MG tablet Commonly known as:  Senokot-S Take 1 tablet by mouth 2 (two) times daily.   THERAGRAN-M PREMIER 50 PLUS Tabs Take 1 tablet by mouth daily.   traZODone 50 MG tablet Commonly known as:  DESYREL Take 0.5 tablets (25 mg total) by mouth at bedtime as needed for sleep.      Follow-up Information    Cammy Copa, MD Follow up.   Specialty:  Family Medicine Contact information: [address] [place] [address] [phone]          The results of significant diagnostics from this hospitalization (including imaging, microbiology, ancillary and laboratory) are listed below for reference.    Microbiology: Recent Results (from the past 240 hour(s))  Culture, blood (routine x 2) Call MD if unable to obtain prior to antibiotics being given     Status: None (Preliminary result)   Collection Time: 03/20/16 11:40 PM  Result Value Ref Range Status   Specimen Description BLOOD RIGHT ARM  Final   Special Requests BOTTLES DRAWN AEROBIC ONLY [place]  Final   Culture   Final    NO GROWTH 3 DAYS Performed at [facility]    Report Status PENDING  Incomplete  Culture, blood (routine x 2) Call MD if unable to obtain prior to antibiotics being given     Status: None (Preliminary result)   Collection Time: 03/20/16 11:40 PM  Result Value Ref Range Status   Specimen Description BLOOD RIGHT HAND  Final   Special Requests IN PEDIATRIC BOTTLE [place]  Final   Culture   Final    NO GROWTH 3 DAYS Performed at [facility]    Report Status PENDING  Incomplete    Labs: Basic Metabolic Panel:  Recent Labs Lab 03/20/16 1815 03/21/16 0418 03/22/16 1801  NA 151* 148* 152*  K 4.3 3.9 3.4*  CL 116* 117* 123*  CO2 23 18* 16*  GLUCOSE 115* 150* 94  BUN 65* 62* 77*  CREATININE 1.29* 1.22* 1.00  CALCIUM 10.5* 9.7 8.8*   Liver Function Tests:  Recent Labs Lab 03/20/16 1815  AST 90*  ALT 146*  ALKPHOS 80  BILITOT 0.4   PROT 7.2  ALBUMIN 2.9*   CBC:  Recent Labs Lab 03/20/16 1815 03/21/16 0418  WBC 18.6* 19.4*  NEUTROABS 16.8*  --   HGB 11.8* 11.1*  HCT 35.8* 32.8*  MCV 93.7 92.1  PLT 293 273   CBG:  Recent Labs Lab 03/23/16 2202  GLUCAP 97   SIGNED: Time coordinating discharge:  30 minutes  Faye Ramsay, MD  [facility] 03/25/2016, 9:20 AM Pager [phone]  If 7PM-7AM, please contact night-coverage [email or web address] Password [email or web address]

## 2016-03-25 NOTE — Progress Notes (Signed)
Assumed care of patient at 1600. Bedside report completed. Pt's husband at bedside verbalized understanding of how to use call light. Bed in lowest and locked position. Pt is nonverbal but appears to be comfortable and in no apparent distress. Personal items and phones within reach. Will continue to monitor patient with hourly rounding

## 2016-03-25 NOTE — Discharge Instructions (Signed)
Hypernatremia Introduction Hypernatremia is too much salt (sodium) in the blood. This happens when there is a shortage of water in the body. The balance of water and sodium in the body is vital to human function. When hypernatremia happens, the cells of the brain can become starved of water. Hypernatremia can happen to anyone, but it usually happens in people who may not drink enough fluids, such as elderly people who may depend on others to get fluids.In severe cases, hypernatremia can be life threatening. What are the causes? This condition may be caused by:  Not drinking enough water. This may happen because of a decreased ability to feel thirst.  A condition in which the kidneys remove too much urine from the body (polyuria). This is often related to diabetes.  Certain medicines, such as diuretics.  Water loss through exercise or sweating.  Diarrhea or vomiting.  Excessive sodium intake.  A genetic condition that affects the kidneys. What are the signs or symptoms? Symptoms of this condition may include:  Increased thirst. You may not have this symptom if your ability to feel thirsty is decreased.  Increased urination.  Muscle twitching or cramps.  Dizziness or light-headedness.  Seizures.  Headache.  Fatigue.  Generalized weakness.  Irritability.  Confusion.  Loss of consciousness.  Coma. Sometimes, hypernatremia is caused by an underlying condition. In this case, symptoms may include:  Nausea.  Vomiting.  Diarrhea.  Fever.  Confusion (delirium).  Memory problems (dementia).  Diabetes.  Cushing syndrome. This condition affects the glands at the top of your kidneys (adrenal glands) that produce the hormone cortisol. How is this diagnosed? This condition may be diagnosed based on:  Your symptoms and medical history. Your health care provider will ask about your water intake, potential water loss, urination, salt intake, medicines, and other  symptoms.  A physical or neurological exam.  Tests, including blood and urine tests. How is this treated? Depending on the severity, this condition may need to be treated right away. It often requires hospitalization. The goal of treatment is to balance the water and salt levels in your body and to find the underlying cause of your water loss. Treatment may be given through an IV tube or by mouth. Treatment may include the following:  Increased water intake.  IV fluids.  Adjustment of medicines that you are taking.  Fever management.  Diabetes control. Ongoing lab tests will be done to help guide treatment options. Follow these instructions at home:  Drink enough fluid to keep your urine clear or pale yellow.  Take over-the-counter and prescription medicines only as told by your health care provider.  Avoid salty processed foods, including canned, jarred, frozen, or boxed foods. Some examples include pickles, frozen dinners, canned soups, potato and corn chips, and olives.  Always drink fluids after exercise or after vomiting or diarrhea.  Keep all follow-up visits as told by your health care provider. This is important. Contact a health care provider if:  You have diarrhea or vomiting.  You have a fever or chills.  You are unable to follow recommendations from your health care provider about fluid intake. Get help right away if:  You are light-headed and weak.  You have a fast heartbeat.  You become confused or disoriented.  You become irritable or restless.  You have a seizure.  You faint.  You have signs of dehydration, such as:  Dark urine, or very little or no urine.  Cracked lips.  No tears.  Dry mouth.  Sunken eyes.  Sleepiness. This information is not intended to replace advice given to you by your health care provider. Make sure you discuss any questions you have with your health care provider. Document Released: 06/03/2011 Document Revised:  08/17/2015 Document Reviewed: 07/27/2014  2017 [employer]

## 2016-03-25 NOTE — Progress Notes (Signed)
[place] Liaison Note  Patient approved for admission to [facility] by hospice physician.  Multiple attempts this morning to reach patient's son [facility] via personal cell as well as patient's home number.  [facility] contact info left at patient's bedside and notified SW Roselyn Reef that unable to reach/leave voicemail.  Will continue to try to establish contact.  Thank you, Moss Mc, RN , BSN, [facility] Director of [place] liaisons are now on [place].  Feel free to call me directly today at [phone] or our main number at [phone].

## 2016-03-25 NOTE — Progress Notes (Signed)
CSW consulted to assist with d/c planning. PN reviewed. Spoke with MD / Levada Dy liaison from [facility]. Family as requested [facility] placement. Pt has been approved for [facility] but Levada Dy / CSW have not been able to connect with pt's son. NSG reports that family has called unit and nsg has updated family requesting them to called Levada Dy to set up meeting. Levada Dy has not yet heard from family and bed availability may be an issue if family doesn't connect with Levada Dy soon. CSW will continue to try to reach pt's son.  Werner Lean LCSW [phone]

## 2016-03-26 LAB — CULTURE, BLOOD (ROUTINE X 2)
CULTURE: NO GROWTH
Culture: NO GROWTH

## 2016-03-26 NOTE — Progress Notes (Signed)
Nursing Note: Paged Iv for concern of infiltrate.Pt has swelling in L hand and arm.Iv team says IV  Site is okay.Jamie Lester who had pt last night says that her L hand and arm was swollen the same as it is now.Ivfs resumed.Will continue to monitor.wbb

## 2016-03-26 NOTE — Progress Notes (Signed)
Pt is ready to d/c to [facility] this morning. Several attempts made to contact pt's son with no success. Family is aware pt will be d/c to [facility] today. D/C Summary has been sent to [facility] for review. No scripts needed. [facility]Ar transport required. Medical necessity form completed. # for report provided to nsg.  Roselyn Reef Panzy Bubeck LCSw [phone]

## 2016-03-26 NOTE — Consult Note (Addendum)
[place] room is available for patient today, 03/26/16. Family completed paper work for transfer yesterday.   RN Please call report to [phone].  Discharge summary has been faxed to [phone].  Thank you,  Erling Conte, LCSW [phone]

## 2016-03-26 NOTE — Progress Notes (Signed)
Pt seen and examined at bedside, plan to discharge to [facility] place today. Please see d/c summary from 03/25/2016. No changes needed.   Faye Ramsay, MD  [facility] Pager [phone] Cell [phone]  If 7PM-7AM, please contact night-coverage [email or web address] Password [email or web address]

## 2016-04-01 ENCOUNTER — Inpatient Hospital Stay: Admission: RE | Admit: 2016-04-01 | Payer: Medicare Other | Source: Ambulatory Visit

## 2016-04-25 DEATH — deceased

## 2017-02-16 IMAGING — CT CT HEAD W/O CM
3 of 4 series · 18 of 47 positions shown, 21 images · non-contrast
Comparison: 07/14/2015

CLINICAL DATA: Increased lethargy and weakness for 2 days.

EXAM:
CT HEAD WITHOUT CONTRAST
TECHNIQUE: Contiguous axial images were obtained from the base of the skull
through the vertex without intravenous contrast.

[Series 201: head w/o, idose (1) · axial · non-contrast · 0.39mm/px · z∈[+123,+243]mm · 12 of 30 slices shown, 15 images]
[im 3/30  brain]
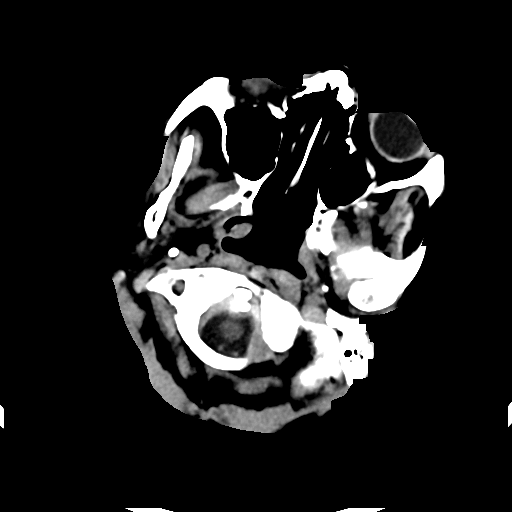
[im 3/30  bone]
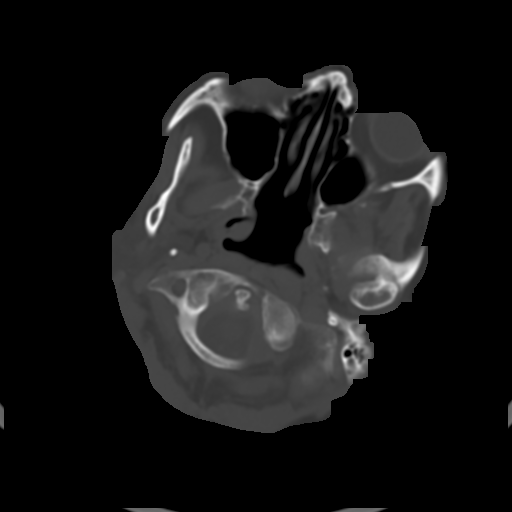
[im 5/30  brain]
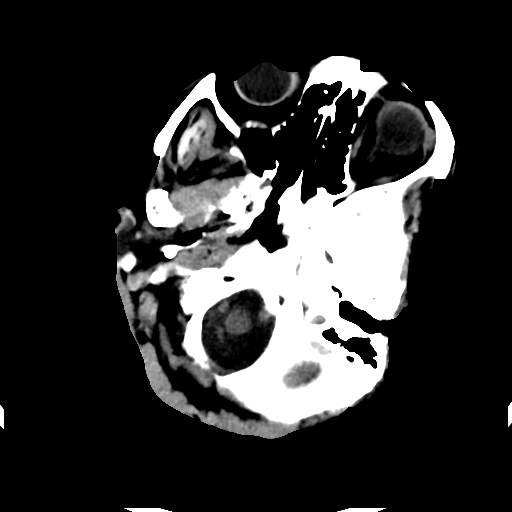
[im 7/30  brain]
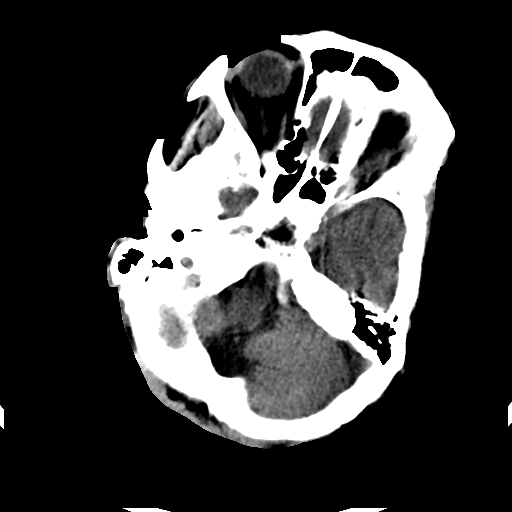
[im 9/30  brain]
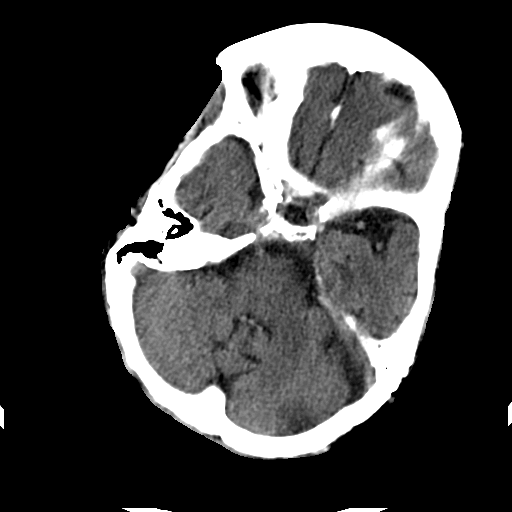
[im 11/30  brain]
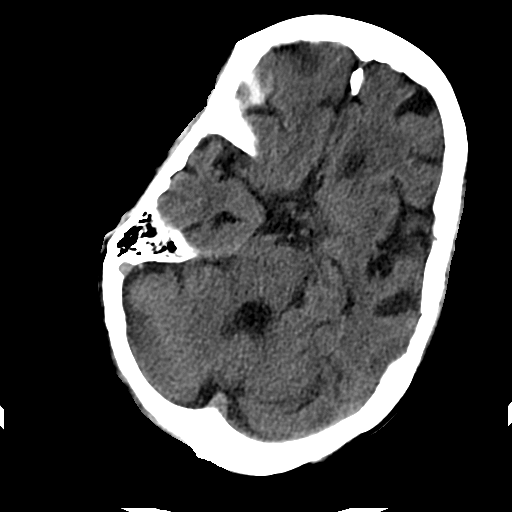
[im 11/30  bone]
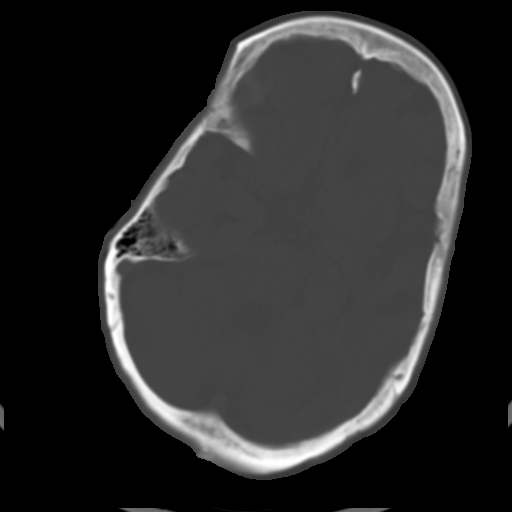
[im 13/30  brain]
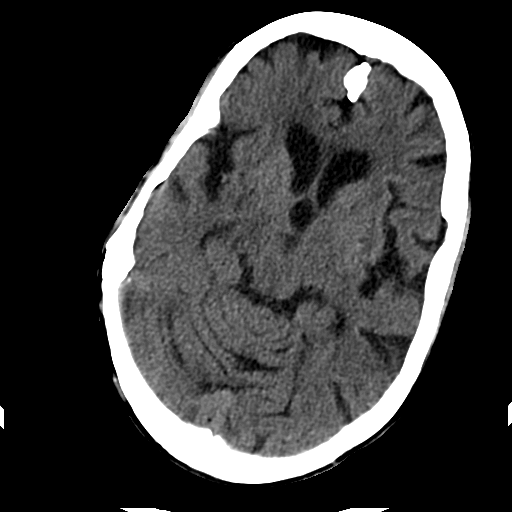
[im 17/30  brain]
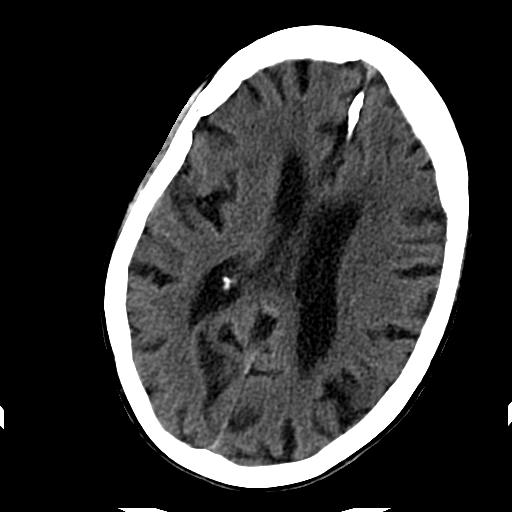
[im 19/30  brain]
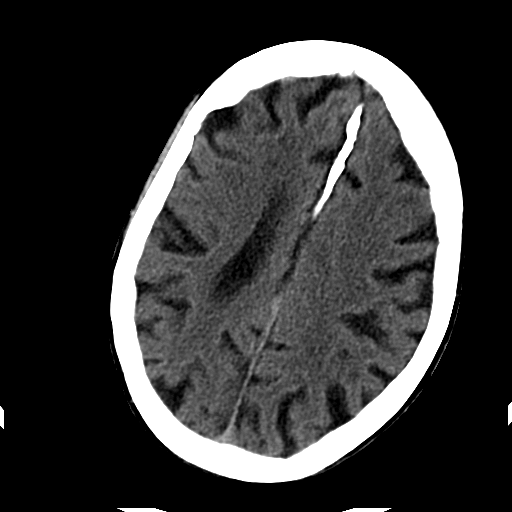
[im 21/30  brain]
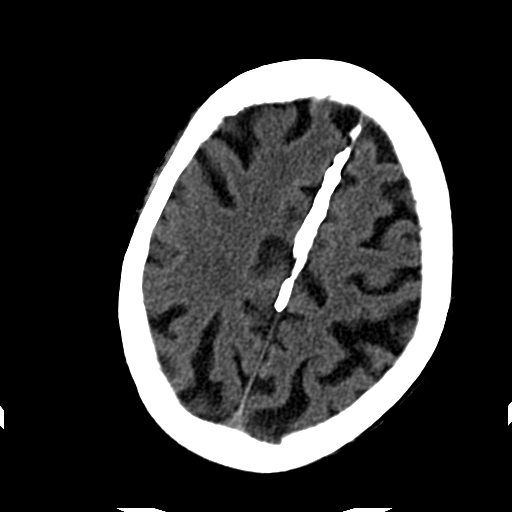
[im 21/30  bone]
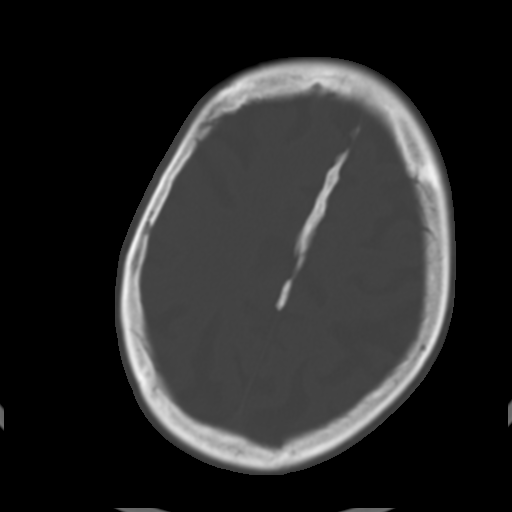
[im 23/30  brain]
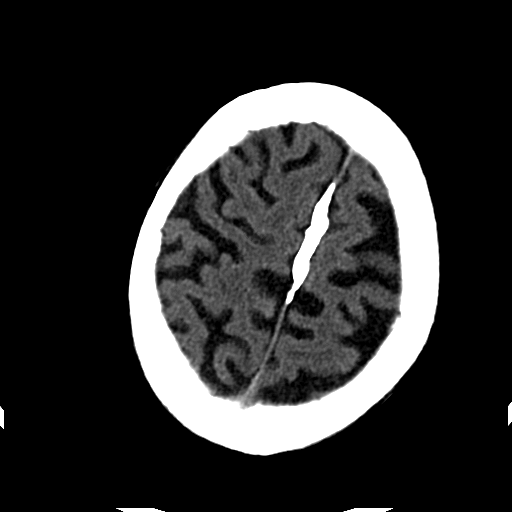
[im 25/30  brain]
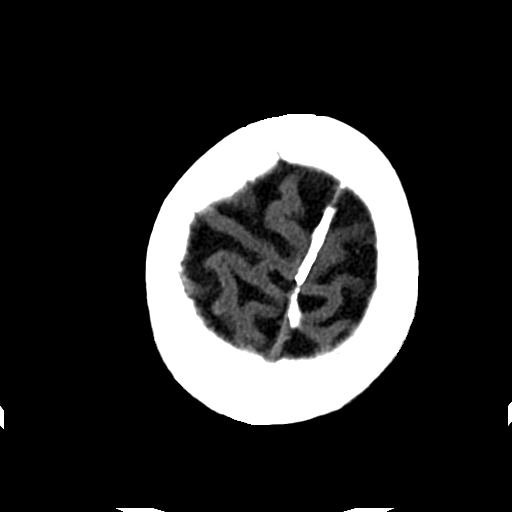
[im 27/30  brain]
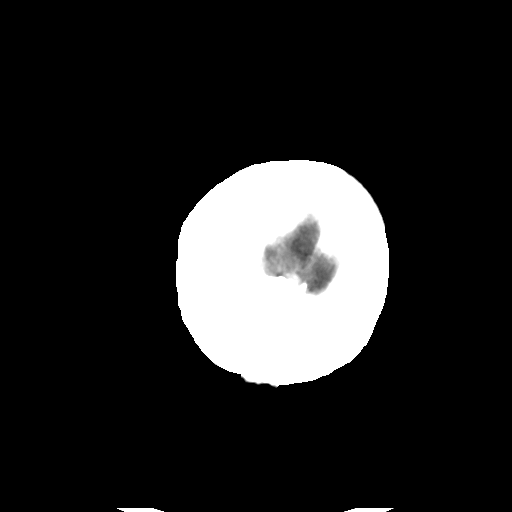

[Series 203: coronal st, idose (1) · coronal · 0.39mm/px · 3 of 66 slices shown]
[im 22/66  brain]
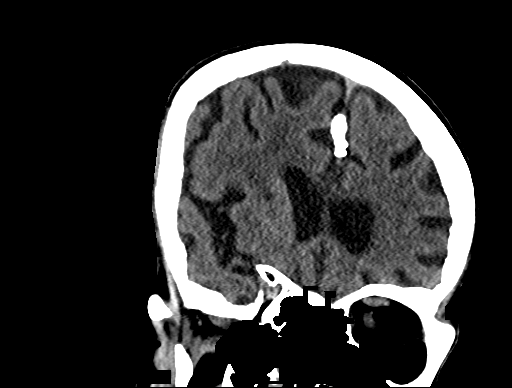
[im 29/66  brain]
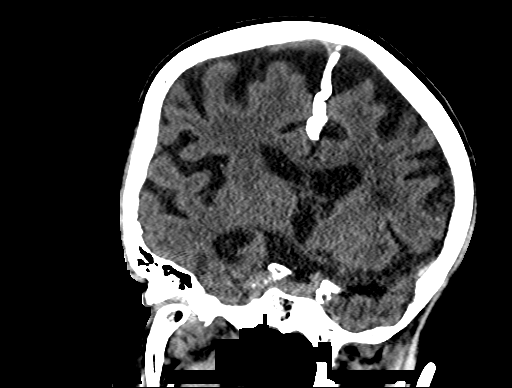
[im 37/66  brain]
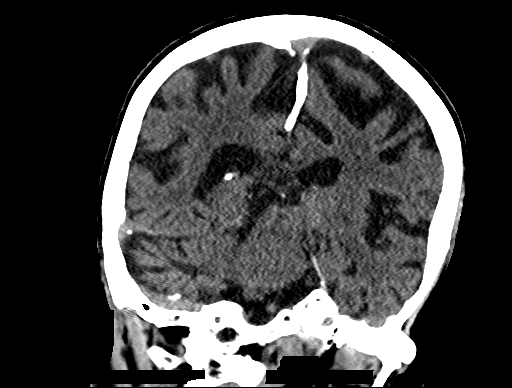

[Series 204: sagittal st, idose (1) · sagittal · 0.39mm/px · 3 of 64 slices shown]
[im 22/64  brain]
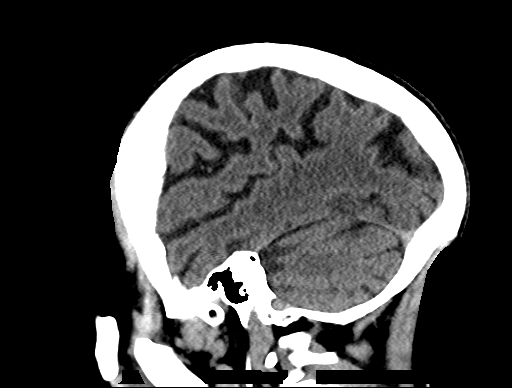
[im 32/64  brain]
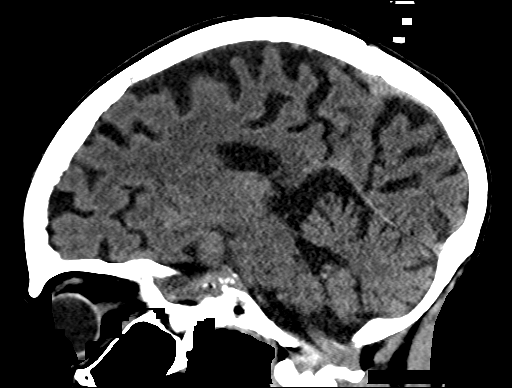
[im 43/64  brain]
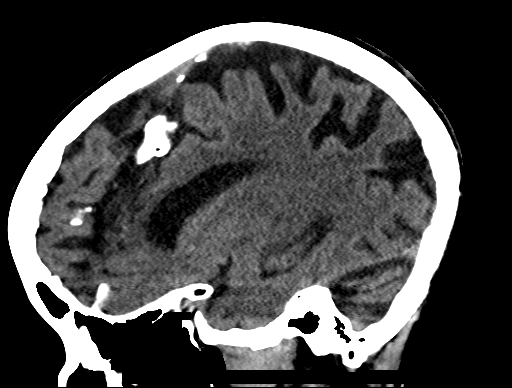

[18 of 47 positions shown; findings below may reference images not displayed]

FINDINGS: Brain: Stable marked brain atrophy and chronic white matter
microvascular ischemic changes throughout the cerebral hemispheres.
No acute intracranial hemorrhage, definite new infarction, mass
lesion, midline shift, herniation, hydrocephalus, or extra-axial
fluid collection. No focal mass effect or edema. Cisterns are
patent. Cerebellar atrophy as well.

Vascular: No hyperdense vessel or unexpected calcification.

Skull: Normal. Negative for fracture or focal lesion.

Sinuses/Orbits: No acute finding.

Other: None.
IMPRESSION: Stable chronic atrophy and microvascular white matter ischemic
changes.

No interval change or acute process by noncontrast CT.

## 2017-02-16 IMAGING — CR DG CHEST 1V PORT
1 series · 1 of 1 positions shown · non-contrast
Comparison: None.

CLINICAL DATA: Cough and lethargy for 2 days.

EXAM:
PORTABLE CHEST 1 VIEW

[AP]
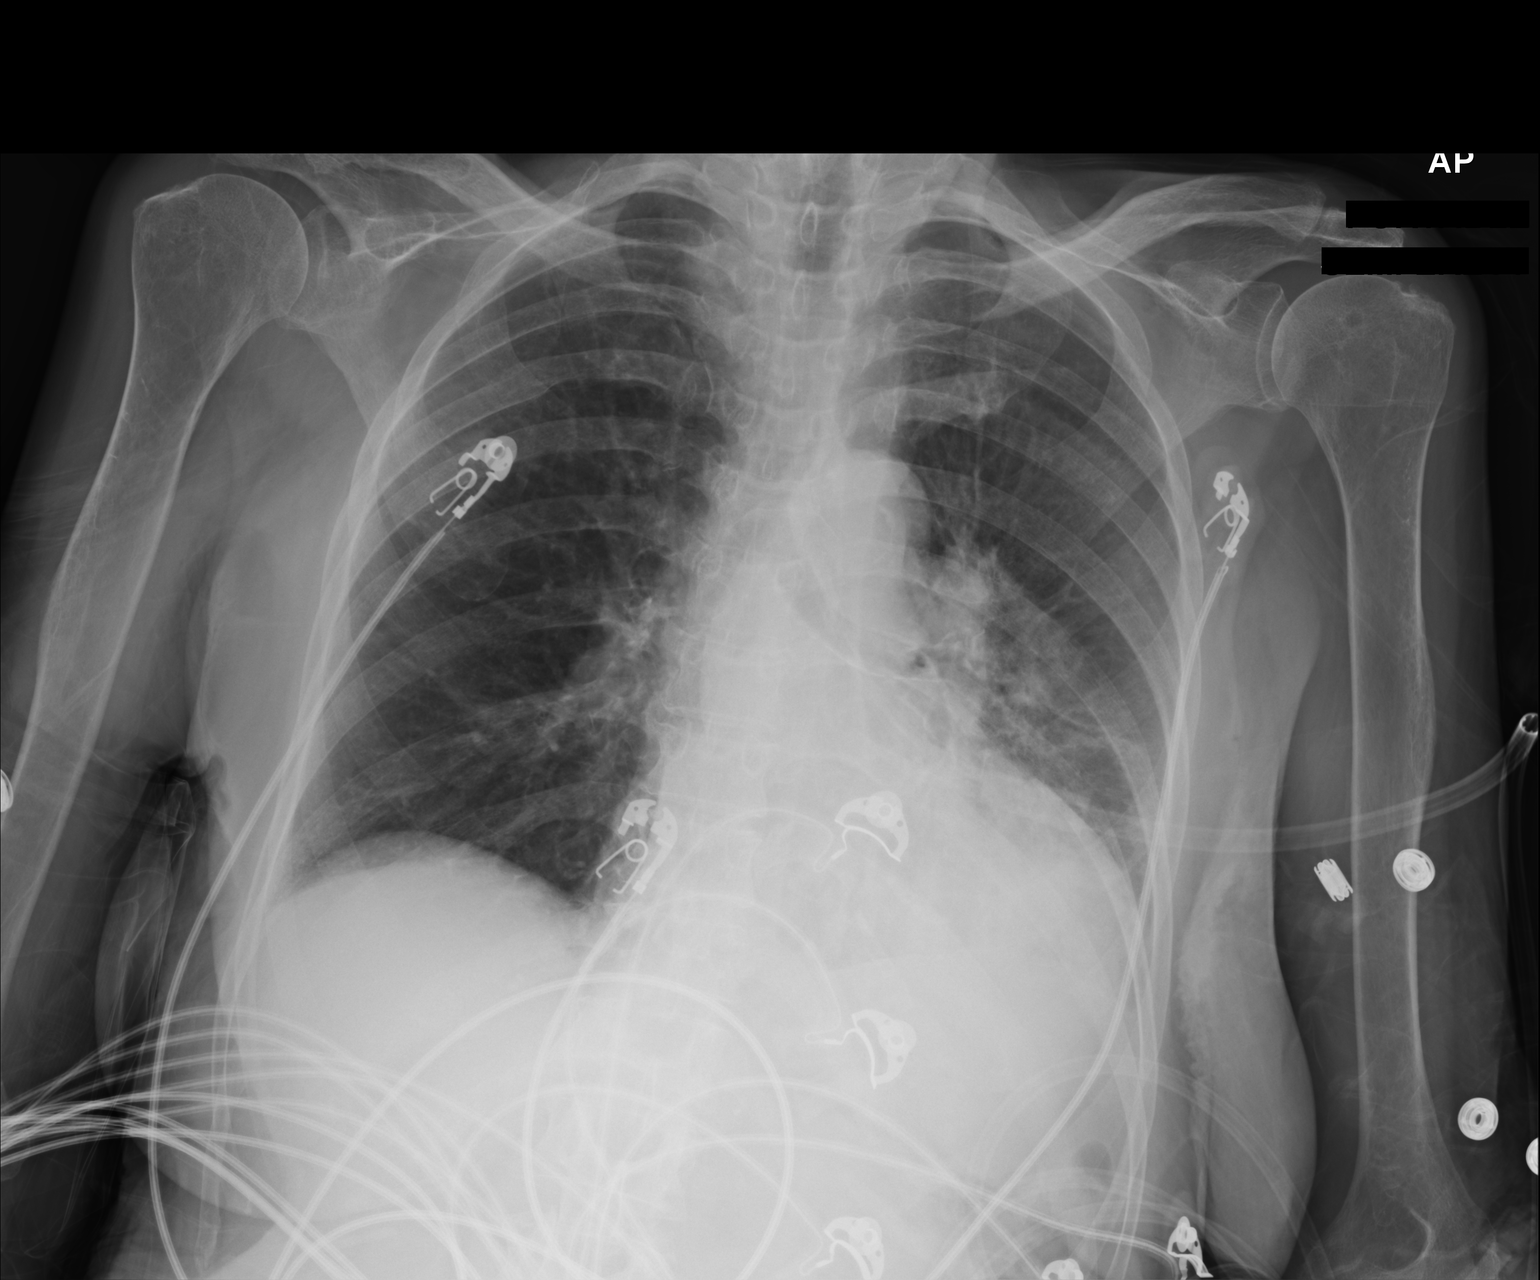

[1 of 1 positions shown; findings below may reference images not displayed]

FINDINGS: There is consolidation in the left base. There is moderate
cardiomegaly. No large effusion. Right lung is clear. Pulmonary
vasculature is normal. Hilar and mediastinal contours are
unremarkable.
IMPRESSION: Left base consolidation, suspicious for pneumonia. Followup PA and
lateral chest X-ray is recommended in 3-4 weeks following trial of
antibiotic therapy to ensure resolution and exclude underlying
malignancy.

## 2023-03-24 ENCOUNTER — Encounter (HOSPITAL_COMMUNITY): Payer: Self-pay | Admitting: *Deleted
